# Patient Record
Sex: Male | Born: 1950
Health system: Southern US, Community
[De-identification: ages and names within clinical notes are randomized; demographics above are authoritative.]

## PROBLEM LIST (undated history)

## (undated) DIAGNOSIS — A159 Respiratory tuberculosis unspecified: Secondary | ICD-10-CM

## (undated) DIAGNOSIS — F4310 Post-traumatic stress disorder, unspecified: Secondary | ICD-10-CM

## (undated) DIAGNOSIS — E785 Hyperlipidemia, unspecified: Secondary | ICD-10-CM

## (undated) DIAGNOSIS — J449 Chronic obstructive pulmonary disease, unspecified: Secondary | ICD-10-CM

## (undated) HISTORY — DX: Hyperlipidemia, unspecified: E78.5

## (undated) HISTORY — DX: Post-traumatic stress disorder, unspecified: F43.10

## (undated) HISTORY — DX: Respiratory tuberculosis unspecified: A15.9

## (undated) HISTORY — DX: Chronic obstructive pulmonary disease, unspecified: J44.9

---

## 1994-08-23 DIAGNOSIS — A159 Respiratory tuberculosis unspecified: Secondary | ICD-10-CM

## 1994-08-23 HISTORY — DX: Respiratory tuberculosis unspecified: A15.9

## 2013-12-28 DIAGNOSIS — R7611 Nonspecific reaction to tuberculin skin test without active tuberculosis: Secondary | ICD-10-CM | POA: Insufficient documentation

## 2015-06-18 ENCOUNTER — Encounter (HOSPITAL_COMMUNITY): Payer: Self-pay | Admitting: Physical Medicine and Rehabilitation

## 2015-06-18 ENCOUNTER — Emergency Department (HOSPITAL_COMMUNITY): Payer: Medicaid Other

## 2015-06-18 ENCOUNTER — Emergency Department (HOSPITAL_COMMUNITY)
Admission: EM | Admit: 2015-06-18 | Discharge: 2015-06-18 | Disposition: A | Discharge status: Home / Self Care | Payer: Medicaid Other | Attending: Emergency Medicine | Admitting: Emergency Medicine

## 2015-06-18 DIAGNOSIS — J40 Bronchitis, not specified as acute or chronic: Secondary | ICD-10-CM

## 2015-06-18 DIAGNOSIS — R05 Cough: Secondary | ICD-10-CM | POA: Diagnosis present

## 2015-06-18 DIAGNOSIS — R51 Headache: Secondary | ICD-10-CM | POA: Diagnosis not present

## 2015-06-18 DIAGNOSIS — J45909 Unspecified asthma, uncomplicated: Secondary | ICD-10-CM | POA: Diagnosis not present

## 2015-06-18 LAB — I-STAT CHEM 8, ED
BUN: 9 mg/dL (ref 6–20)
CHLORIDE: 99 mmol/L — AB (ref 101–111)
CREATININE: 0.8 mg/dL (ref 0.61–1.24)
Calcium, Ion: 1.14 mmol/L (ref 1.13–1.30)
Glucose, Bld: 91 mg/dL (ref 65–99)
HEMATOCRIT: 50 % (ref 39.0–52.0)
Hemoglobin: 17 g/dL (ref 13.0–17.0)
Potassium: 4 mmol/L (ref 3.5–5.1)
SODIUM: 138 mmol/L (ref 135–145)
TCO2: 25 mmol/L (ref 0–100)

## 2015-06-18 LAB — CBC WITH DIFFERENTIAL/PLATELET
BASOS PCT: 0 %
Basophils Absolute: 0 10*3/uL (ref 0.0–0.1)
EOS ABS: 0.1 10*3/uL (ref 0.0–0.7)
Eosinophils Relative: 1 %
HEMATOCRIT: 45.9 % (ref 39.0–52.0)
HEMOGLOBIN: 15.9 g/dL (ref 13.0–17.0)
LYMPHS ABS: 2.4 10*3/uL (ref 0.7–4.0)
Lymphocytes Relative: 33 %
MCH: 31.6 pg (ref 26.0–34.0)
MCHC: 34.6 g/dL (ref 30.0–36.0)
MCV: 91.3 fL (ref 78.0–100.0)
MONOS PCT: 9 %
Monocytes Absolute: 0.6 10*3/uL (ref 0.1–1.0)
NEUTROS ABS: 4.1 10*3/uL (ref 1.7–7.7)
NEUTROS PCT: 57 %
Platelets: 186 10*3/uL (ref 150–400)
RBC: 5.03 MIL/uL (ref 4.22–5.81)
RDW: 13.3 % (ref 11.5–15.5)
WBC: 7.2 10*3/uL (ref 4.0–10.5)

## 2015-06-18 MED ORDER — IPRATROPIUM-ALBUTEROL 0.5-2.5 (3) MG/3ML IN SOLN
3.0000 mL | Freq: Once | RESPIRATORY_TRACT | Status: AC
  Administered 2015-06-18: 3 mL via RESPIRATORY_TRACT
  Filled 2015-06-18: qty 3

## 2015-06-18 MED ORDER — AZITHROMYCIN 250 MG PO TABS: ORAL_TABLET | ORAL | Status: DC

## 2015-06-18 MED ORDER — ALBUTEROL SULFATE (2.5 MG/3ML) 0.083% IN NEBU
2.5000 mg | INHALATION_SOLUTION | Freq: Once | RESPIRATORY_TRACT | Status: AC
  Administered 2015-06-18: 2.5 mg via RESPIRATORY_TRACT
  Filled 2015-06-18: qty 3

## 2015-06-18 MED ORDER — LEVOFLOXACIN 500 MG PO TABS: 500.0000 mg | ORAL_TABLET | Freq: Every day | ORAL | Status: DC

## 2015-06-18 MED ORDER — IOHEXOL 300 MG/ML  SOLN
75.0000 mL | Freq: Once | INTRAMUSCULAR | Status: AC | PRN
  Administered 2015-06-18: 75 mL via INTRAVENOUS

## 2015-06-18 NOTE — ED Notes (Signed)
Pt eating snack at the time. Awaiting CT results.

## 2015-06-18 NOTE — ED Notes (Signed)
CT notified, pt ready for study.

## 2015-06-18 NOTE — ED Notes (Signed)
PT placed in gown and in bed. Pt monitored by pulse ox and bp cuff. 

## 2015-06-18 NOTE — Discharge Instructions (Signed)
Follow up with Dr. Sherene SiresWert or one of his partners in 1-2 weeks

## 2015-06-18 NOTE — ED Notes (Signed)
Pt talking on phone in exam room, does not want to have IV started at the time.

## 2015-06-18 NOTE — ED Provider Notes (Signed)
CSN: [id number]     Arrival date & time 06/18/15  0913 History   First MD Initiated Contact with Patient 06/18/15 [phone]     Chief Complaint  Patient presents with  . Cough  . Headache     (Consider location/radiation/quality/duration/timing/severity/associated sxs/prior Treatment) Patient is a 64 y.o. male presenting with cough and headaches. The history is provided by the patient (The patient complains of cough yellow sputum production. No fever chills no blood. He does have a history of positive PPD in 1999).  Cough Associated symptoms: headaches   Associated symptoms: no chest pain, no eye discharge and no rash   Headache Associated symptoms: cough   Associated symptoms: no abdominal pain, no back pain, no congestion, no diarrhea, no fatigue, no seizures and no sinus pressure     Past Medical History  Diagnosis Date  . Asthma    History reviewed. No pertinent past surgical history. No family history on file. Social History  Substance Use Topics  . Smoking status: Never Smoker   . Smokeless tobacco: None  . Alcohol Use: No    Review of Systems  Constitutional: Negative for appetite change and fatigue.  HENT: Negative for congestion, ear discharge and sinus pressure.   Eyes: Negative for discharge.  Respiratory: Positive for cough.   Cardiovascular: Negative for chest pain.  Gastrointestinal: Negative for abdominal pain and diarrhea.  Genitourinary: Negative for frequency and hematuria.  Musculoskeletal: Negative for back pain.  Skin: Negative for rash.  Neurological: Positive for headaches. Negative for seizures.  Psychiatric/Behavioral: Negative for hallucinations.      Allergies  Review of patient's allergies indicates no known allergies.  Home Medications   Prior to Admission medications   Medication Sig Start Date End Date Taking? Authorizing Provider  levofloxacin (LEVAQUIN) 500 MG tablet Take 1 tablet (500 mg total) by mouth daily. 06/18/15   Bethann Berkshire, MD   BP 150/76 mmHg  Pulse 62  Temp(Src) 98.7 F (37.1 C) (Oral)  Resp 18  SpO2 99% Physical Exam  Constitutional: He is oriented to person, place, and time. He appears well-developed.  HENT:  Head: Normocephalic.  Eyes: Conjunctivae and EOM are normal. No scleral icterus.  Neck: Neck supple. No thyromegaly present.  Cardiovascular: Normal rate and regular rhythm.  Exam reveals no gallop and no friction rub.   No murmur heard. Pulmonary/Chest: No stridor. He has no wheezes. He has no rales. He exhibits no tenderness.  Abdominal: He exhibits no distension. There is no tenderness. There is no rebound.  Musculoskeletal: Normal range of motion. He exhibits no edema.  Lymphadenopathy:    He has no cervical adenopathy.  Neurological: He is oriented to person, place, and time. He exhibits normal muscle tone. Coordination normal.  Skin: No rash noted. No erythema.  Psychiatric: He has a normal mood and affect. His behavior is normal.    ED Course  Procedures (including critical care time) Labs Review Labs Reviewed  I-STAT CHEM 8, ED - Abnormal; Notable for the following:    Chloride 99 (*)    All other components within normal limits  CBC WITH DIFFERENTIAL/PLATELET    Imaging Review Dg Chest 2 View  06/18/2015  CLINICAL DATA:  64 year old male with productive cough, wheezing, shortness of breath, fever, headache. Initial encounter. EXAM: CHEST  2 VIEW COMPARISON:  None. FINDINGS: Large lung volumes. Tortuous thoracic aorta. Other mediastinal contours are within normal limits. Visualized tracheal air column is within normal limits. Bilateral hilar retraction indicating upper lobe scarring. There  is a combination of attenuated bronchovascular markings in the right suprahilar upper lobe, as well as reticulonodular increased density. No pneumothorax, pulmonary edema, pleural effusion or other confluent pulmonary opacity. Mild scoliosis. No acute osseous abnormality identified.  IMPRESSION: Hyperinflation and chronic upper lobe lung disease suspected with superimposed right upper lobe reticulonodular density suspicious for infectious exacerbation in this setting. No pleural effusion. Electronically Signed   By: Odessa FlemingH  Hall M.D.   On: 06/18/2015 10:20   Ct Chest W Contrast  06/18/2015  CLINICAL DATA:  Patient has history of TB. Patient complains of coughing yellow sputum for the last 4 days. EXAM: CT CHEST WITH CONTRAST TECHNIQUE: Multidetector CT imaging of the chest was performed during intravenous contrast administration. CONTRAST:  75mL OMNIPAQUE IOHEXOL 300 MG/ML  SOLN COMPARISON:  Chest radiograph dated 06/18/2015 FINDINGS: No pericardial effusion. The heart is normal in size. Thoracic aorta is normal in caliber. Visualized thyroid is unremarkable. No pathologically enlarged hilar, mediastinal, or axillary lymph nodes are seen. Airways are patent. The lungs are hyperinflated. There is a sub solid spiculated opacity which measures 12 mm in the superior segment of right lower lobe subpleurally. Similar linear ground-glass areas of opacity are seen in the posterior perifissural portion of the right upper lobe. There is atelectasis and volume loss in the right upper lobe. There is a 4.2 cm left parapelvic renal cyst. Visualized upper abdomen is otherwise unremarkable. No evidence of acute osseous abnormality. Osteoarthritic changes are seen in the lower thoracic and upper lumbar sacral spine. Evaluation of the osseous structures demonstrates disc space narrowing and associated endplate sclerotic and erosive changes at C7-T1, T11-T12, L1-L2 and L2-L3. The rest of the vertebral bodies and disc spaces are relatively preserved. IMPRESSION: Hyperinflation of the lungs, consistent with chronic obstructive pulmonary disease. Volume loss and atelectatic changes in the right upper lobe, with perifissural areas of streaky opacities. Subpleural area of ground-glass opacity in the superior segment of  right lower lobe. These findings may represent postinfectious scarring. Bronchoalveolar carcinoma, although possible, is felt less likely. There is no evidence of focal airspace consolidation, or cavitary lesions. Follow-up chest CT at 6-12 months is recommended. Disc centered sclerotic and erosive changes at C7-T1, T11-T12, L1-L2 and L2-L3. These may represent osteoarthritic changes, however in the background of relatively preserved dick spaces in the remaining thoracic spine, discitis is also of consideration. Left parapelvic renal cyst. Electronically Signed   By: Ted Mcalpineobrinka  Dimitrova M.D.   On: 06/18/2015 13:51   I have personally reviewed and evaluated these images and lab results as part of my medical decision-making.   EKG Interpretation None      MDM   Final diagnoses:  Bronchitis      Patient labs unremarkable CT scan shows an area that concerning for possible bronchogenic carcinoma. No obvious infection consistent with TB. Will treat patient with Levaquin for bronchitis. Will refer to pulmonary for follow-up  Bethann BerkshireJoseph Cela Newcom, MD 06/18/15 1418

## 2015-06-18 NOTE — ED Notes (Signed)
Pt reports cough with yellow sputum, headaches, fever and fatigue. Ongoing for several weeks. Reports he had TB in 1999, was treated at that time. Respirations unlabored. NAD

## 2015-07-08 ENCOUNTER — Institutional Professional Consult (permissible substitution): Payer: Medicaid Other | Admitting: Internal Medicine

## 2015-07-15 ENCOUNTER — Emergency Department (HOSPITAL_COMMUNITY)
Admission: EM | Admit: 2015-07-15 | Discharge: 2015-07-15 | Disposition: A | Discharge status: Home / Self Care | Payer: Medicare Other | Attending: Emergency Medicine | Admitting: Emergency Medicine

## 2015-07-15 ENCOUNTER — Encounter (HOSPITAL_COMMUNITY): Payer: Self-pay

## 2015-07-15 DIAGNOSIS — R319 Hematuria, unspecified: Secondary | ICD-10-CM

## 2015-07-15 DIAGNOSIS — N390 Urinary tract infection, site not specified: Secondary | ICD-10-CM | POA: Insufficient documentation

## 2015-07-15 DIAGNOSIS — F17210 Nicotine dependence, cigarettes, uncomplicated: Secondary | ICD-10-CM | POA: Insufficient documentation

## 2015-07-15 DIAGNOSIS — R300 Dysuria: Secondary | ICD-10-CM | POA: Diagnosis present

## 2015-07-15 DIAGNOSIS — J45909 Unspecified asthma, uncomplicated: Secondary | ICD-10-CM | POA: Diagnosis not present

## 2015-07-15 LAB — URINE MICROSCOPIC-ADD ON

## 2015-07-15 LAB — URINALYSIS, ROUTINE W REFLEX MICROSCOPIC
Glucose, UA: NEGATIVE mg/dL
Ketones, ur: NEGATIVE mg/dL
NITRITE: NEGATIVE
Protein, ur: NEGATIVE mg/dL
SPECIFIC GRAVITY, URINE: 1.023 (ref 1.005–1.030)
pH: 6 (ref 5.0–8.0)

## 2015-07-15 MED ORDER — CEPHALEXIN 500 MG PO CAPS: 500.0000 mg | ORAL_CAPSULE | Freq: Three times a day (TID) | ORAL | Status: DC

## 2015-07-15 NOTE — ED Notes (Signed)
Per Pt, Pt started to have burning with urination starting a few days ago. Pt denies discharge, nausea, vomiting, diarrhea, fevers, body aches. Reports pain only with urination.

## 2015-07-15 NOTE — ED Notes (Signed)
Patient walked out into the hallway. Patient reported he was going outside to smoke. Patient made aware that due to hospital policy, he is not allowed to leave the area of patient care. Made aware he is allowed to sign out as a patient and decline care to leave, but during this time He is not allowed to go outside and smoke. Patient offered a Nicotine patch and made aware that we are waiting on his urine to results and it would take about thirty minutes. Denied Nicotine patch. Patient reassured that as soon as the urine resulted, the MD would decide whether the patient needed antibiotics and we would work to get the patient out as soon as possible. Patient stated, "Thats okay, I will go out the back way." Pt asked not to leave or to sign out as a patient. Patient walked back into room and stated, "I will give you twenty minutes."

## 2015-07-15 NOTE — Discharge Instructions (Signed)

## 2015-07-15 NOTE — ED Notes (Signed)
MD Knott at the bedside   

## 2015-07-15 NOTE — ED Provider Notes (Signed)
CSN: [id number]     Arrival date & time 07/15/15  [id number] History   First MD Initiated Contact with Patient 07/15/15 [phone]     Chief Complaint  Patient presents with  . Dysuria     (Consider location/radiation/quality/duration/timing/severity/associated sxs/prior Treatment) Patient is a 64 y.o. male presenting with dysuria. The history is provided by the patient.  Dysuria This is a new problem. The current episode started more than 2 days ago. The problem occurs constantly. The problem has not changed since onset.Pertinent negatives include no abdominal pain and no shortness of breath. Nothing aggravates the symptoms. Nothing relieves the symptoms. He has tried nothing for the symptoms. The treatment provided no relief.    Past Medical History  Diagnosis Date  . Asthma    History reviewed. No pertinent past surgical history. History reviewed. No pertinent family history. Social History  Substance Use Topics  . Smoking status: Current Every Day Smoker -- 1.00 packs/day    Types: Cigarettes  . Smokeless tobacco: Never Used  . Alcohol Use: No    Review of Systems  Respiratory: Negative for shortness of breath.   Gastrointestinal: Negative for abdominal pain.  Genitourinary: Positive for dysuria.  All other systems reviewed and are negative.     Allergies  Review of patient's allergies indicates no known allergies.  Home Medications   Prior to Admission medications   Not on File   BP 144/103 mmHg  Pulse 66  Temp(Src) 97.7 F (36.5 C) (Oral)  Resp 20  SpO2 100% Physical Exam  Constitutional: He is oriented to person, place, and time. He appears well-developed and well-nourished. No distress.  HENT:  Head: Normocephalic and atraumatic.  Eyes: Conjunctivae are normal.  Neck: Neck supple. No tracheal deviation present.  Cardiovascular: Normal rate, regular rhythm and normal heart sounds.   Pulmonary/Chest: Effort normal and breath sounds normal. No respiratory  distress.  Abdominal: Soft. He exhibits no distension. There is no tenderness.  Neurological: He is alert and oriented to person, place, and time.  Skin: Skin is warm and dry.  Psychiatric: He has a normal mood and affect.    ED Course  Procedures (including critical care time) Labs Review Labs Reviewed  URINALYSIS, ROUTINE W REFLEX MICROSCOPIC (NOT AT [facility]) - Abnormal; Notable for the following:    APPearance HAZY (*)    Hgb urine dipstick SMALL (*)    Bilirubin Urine SMALL (*)    Leukocytes, UA SMALL (*)    All other components within normal limits  URINE MICROSCOPIC-ADD ON - Abnormal; Notable for the following:    Squamous Epithelial / LPF 0-5 (*)    Bacteria, UA RARE (*)    All other components within normal limits  URINE CULTURE    Imaging Review No results found. I have personally reviewed and evaluated these images and lab results as part of my medical decision-making.   EKG Interpretation None      MDM   Final diagnoses:  Urinary tract infection with hematuria, site unspecified    64 y.o. male presents with typical UTI symptom of dysuria. Has had once previously and stating this is similar. Given ABx for suspicious urinalysis results. Sent for culture. Patient needs to establish primary care in the area and was provided contact information to do so.     Lyndal Pulleyaniel Lizza Huffaker, MD 07/16/15 [phone]

## 2015-07-16 LAB — URINE CULTURE: CULTURE: NO GROWTH

## 2015-08-01 ENCOUNTER — Encounter: Payer: Self-pay | Admitting: Internal Medicine

## 2015-08-01 ENCOUNTER — Ambulatory Visit (INDEPENDENT_AMBULATORY_CARE_PROVIDER_SITE_OTHER): Payer: Medicare Other | Admitting: Internal Medicine

## 2015-08-01 ENCOUNTER — Other Ambulatory Visit (INDEPENDENT_AMBULATORY_CARE_PROVIDER_SITE_OTHER): Payer: Medicare Other

## 2015-08-01 VITALS — BP 136/84 | HR 60 | Ht 74.0 in | Wt 171.0 lb

## 2015-08-01 DIAGNOSIS — R053 Chronic cough: Secondary | ICD-10-CM

## 2015-08-01 DIAGNOSIS — Z720 Tobacco use: Secondary | ICD-10-CM | POA: Diagnosis not present

## 2015-08-01 DIAGNOSIS — E785 Hyperlipidemia, unspecified: Secondary | ICD-10-CM | POA: Diagnosis not present

## 2015-08-01 DIAGNOSIS — D751 Secondary polycythemia: Secondary | ICD-10-CM

## 2015-08-01 DIAGNOSIS — R05 Cough: Secondary | ICD-10-CM | POA: Diagnosis not present

## 2015-08-01 DIAGNOSIS — R9389 Abnormal findings on diagnostic imaging of other specified body structures: Secondary | ICD-10-CM

## 2015-08-01 DIAGNOSIS — F17210 Nicotine dependence, cigarettes, uncomplicated: Secondary | ICD-10-CM | POA: Diagnosis not present

## 2015-08-01 DIAGNOSIS — R938 Abnormal findings on diagnostic imaging of other specified body structures: Secondary | ICD-10-CM | POA: Diagnosis not present

## 2015-08-01 HISTORY — DX: Hyperlipidemia, unspecified: E78.5

## 2015-08-01 LAB — CBC WITH DIFFERENTIAL/PLATELET
BASOS ABS: 0 10*3/uL (ref 0.0–0.1)
Basophils Relative: 0.5 % (ref 0.0–3.0)
Eosinophils Absolute: 0 10*3/uL (ref 0.0–0.7)
Eosinophils Relative: 0.6 % (ref 0.0–5.0)
HCT: 53.5 % — ABNORMAL HIGH (ref 39.0–52.0)
Hemoglobin: 17.9 g/dL — ABNORMAL HIGH (ref 13.0–17.0)
LYMPHS ABS: 1.8 10*3/uL (ref 0.7–4.0)
Lymphocytes Relative: 29.8 % (ref 12.0–46.0)
MCHC: 33.4 g/dL (ref 30.0–36.0)
MCV: 92.1 fl (ref 78.0–100.0)
MONO ABS: 0.5 10*3/uL (ref 0.1–1.0)
Monocytes Relative: 8.5 % (ref 3.0–12.0)
NEUTROS ABS: 3.7 10*3/uL (ref 1.4–7.7)
NEUTROS PCT: 60.6 % (ref 43.0–77.0)
PLATELETS: 206 10*3/uL (ref 150.0–400.0)
RBC: 5.81 Mil/uL (ref 4.22–5.81)
RDW: 13.6 % (ref 11.5–15.5)
WBC: 6.2 10*3/uL (ref 4.0–10.5)

## 2015-08-01 LAB — LIPID PANEL
CHOLESTEROL: 156 mg/dL (ref 0–200)
HDL: 61.1 mg/dL (ref >39.00)
LDL CALC: 84 mg/dL (ref 0–99)
NonHDL: 94.77
TRIGLYCERIDES: 52 mg/dL (ref 0.0–149.0)
Total CHOL/HDL Ratio: 3
VLDL: 10.4 mg/dL (ref 0.0–40.0)

## 2015-08-01 LAB — HEPATIC FUNCTION PANEL
ALBUMIN: 4.4 g/dL (ref 3.5–5.2)
ALK PHOS: 58 U/L (ref 39–117)
ALT: 25 U/L (ref 0–53)
AST: 29 U/L (ref 0–37)
BILIRUBIN DIRECT: 0.2 mg/dL (ref 0.0–0.3)
Total Bilirubin: 0.7 mg/dL (ref 0.2–1.2)
Total Protein: 8.1 g/dL (ref 6.0–8.3)

## 2015-08-01 LAB — SEDIMENTATION RATE: SED RATE: 12 mm/h (ref 0–22)

## 2015-08-01 LAB — TSH: TSH: 0.74 u[IU]/mL (ref 0.35–4.50)

## 2015-08-01 NOTE — Patient Instructions (Signed)
The key is to stop smoking completely before smoking completely stops you!   Please remember to go to the lab  department downstairs for your tests - we will call you with the results when they are available.    Please schedule a follow up office visit in 6 weeks, call sooner if needed with pfts and cxr on return

## 2015-08-01 NOTE — Progress Notes (Signed)
Subjective:    Patient ID: Casey NunneryCharles Ryan, male    DOB: 12-13-1950,    MRN: [id number]  HPI   89 yobm active smoker referred by ER to pulmonary clinic 08/01/2015 re bronchitis / abn cxr   08/01/2015 1st [facility] office visit/ Sebastion Jun   Chief Complaint  Patient presents with  . Pulmonary Consult    Referred by Dr. Lawson FiscalJospeh Zammit. Pt states went to ED in Oct 2016 and was dxed with Bronchitis. All of his symptoms have since resolved and he has no co's today.   Not limited by breathing from desired activities   No am flares/ sleeps ok  Treated and released from TB in [place] in 1996 rx x 6244m  No  cp or chest tightness, subjective wheeze overt sinus or hb symptoms. No unusual exp hx or h/o childhood pna/ asthma or knowledge of premature birth.  Sleeping ok without nocturnal  or early am exacerbation  of respiratory  c/o's or need for noct saba. Also denies any obvious fluctuation of symptoms with weather or environmental changes or other aggravating or alleviating factors except as outlined above   Current Medications, Allergies, Complete Past Medical History, Past Surgical History, Family History, and Social History were reviewed in [facility] electronic medical record.              Review of Systems  Constitutional: Negative for fever, chills, activity change, appetite change and unexpected weight change.  HENT: Negative for congestion, dental problem, postnasal drip, rhinorrhea, sneezing, sore throat, trouble swallowing and voice change.   Eyes: Negative for visual disturbance.  Respiratory: Negative for cough, choking and shortness of breath.   Cardiovascular: Negative for chest pain and leg swelling.  Gastrointestinal: Negative for nausea, vomiting and abdominal pain.  Genitourinary: Negative for difficulty urinating.  Musculoskeletal: Negative for arthralgias.  Skin: Negative for rash.  Psychiatric/Behavioral: Negative for behavioral problems and confusion.        Objective:   Physical Exam  amb bm very somber  Wt Readings from Last 3 Encounters:  08/01/15 171 lb (77.565 kg)    Vital signs reviewed    HEENT: nl dentition, turbinates, and oropharynx. Nl external ear canals without cough reflex   NECK :  without JVD/Nodes/TM/ nl carotid upstrokes bilaterally   LUNGS: no acc muscle use,  Nl contour chest which is clear to A and P bilaterally without cough on insp or exp maneuvers   CV:  RRR  no s3 or murmur or increase in P2, no edema   ABD:  soft and nontender with nl inspiratory excursion in the supine position. No bruits or organomegaly, bowel sounds nl  MS:  Nl gait/ ext warm without deformities, calf tenderness, cyanosis or clubbing No obvious joint restrictions   SKIN: warm and dry without lesions    NEURO:  alert, approp, nl sensorium with  no motor deficits      I personally reviewed images and agree with radiology impression as follows:  CT Chest   06/18/15   Hyperinflation of the lungs, consistent with chronic obstructive pulmonary disease.  Volume loss and atelectatic changes in the right upper lobe, with perifissural areas of streaky opacities. Subpleural area of ground-glass opacity in the superior segment of right lower lobe.     Labs ordered/ reviewed:  Lab 08/01/15 0931  HGB 17.9*  HCT 53.5*  WBC 6.2  PLT 206.0     Lab Results  Component Value Date   TSH 0.74 08/01/2015  Lab Results  Component Value Date   ESRSEDRATE 12 08/01/2015         Assessment & Plan:

## 2015-08-02 DIAGNOSIS — R9389 Abnormal findings on diagnostic imaging of other specified body structures: Secondary | ICD-10-CM | POA: Insufficient documentation

## 2015-08-02 DIAGNOSIS — F17210 Nicotine dependence, cigarettes, uncomplicated: Secondary | ICD-10-CM | POA: Insufficient documentation

## 2015-08-02 DIAGNOSIS — D751 Secondary polycythemia: Secondary | ICD-10-CM | POA: Insufficient documentation

## 2015-08-02 NOTE — Assessment & Plan Note (Signed)
S/p rx for TB mid 1990s in [place],  no records available  His chest x-ray and symptoms are perfectly consistent with previous tuberculosis. Since we don't have previous x-rays I strongly recommended that he have a follow-up to quit multiple points on the curve and will place him in the typical file for this purpose  Discussed in detail all the  indications, usual  risks and alternatives  relative to the benefits with patient who agrees to proceed with conservative f/u as outlined

## 2015-08-02 NOTE — Assessment & Plan Note (Signed)

## 2015-08-02 NOTE — Assessment & Plan Note (Signed)
Lab Results  Component Value Date   CHOL 156 08/01/2015   HDL 61.10 08/01/2015   LDLCALC 84 08/01/2015   TRIG 52.0 08/01/2015   CHOLHDL 3 08/01/2015     This is a surprisingly good profile noting that the HDL is actually quite high for a smoker. No treatment needed

## 2015-08-02 NOTE — Assessment & Plan Note (Signed)
  Lab Results  Component Value Date   HGB 17.9* 08/01/2015   HGB 15.9 06/18/2015   HGB 17.0 06/18/2015    Advised this is likely due to cigarette smoking and again strongly should cut down if not quit altogether. We will check it again when returns

## 2015-08-02 NOTE — Assessment & Plan Note (Addendum)
C/w  CB from smoking, discussed separately  Needs baseline pfts to see if also has element of copd

## 2015-08-04 LAB — ALLERGY FULL PROFILE
Allergen, D pternoyssinus,d7: <0.1 kU/L
Allergen,Goose feathers, e70: <0.1 kU/L
Aspergillus fumigatus, m3: <0.1 kU/L
Bahia Grass: <0.1 kU/L
CANDIDA ALBICANS: 0.14 kU/L — AB
Common Ragweed: <0.1 kU/L
D. farinae: <0.1 kU/L
Elm IgE: <0.1 kU/L
Fescue: <0.1 kU/L
G005 Rye, Perennial: <0.1 kU/L
Goldenrod: <0.1 kU/L
Helminthosporium halodes: <0.1 kU/L
House Dust Hollister: <0.1 kU/L
IGE (IMMUNOGLOBULIN E), SERUM: 243 kU/L — AB (ref <115)
Lamb's Quarters: <0.1 kU/L
Stemphylium Botryosum: <0.1 kU/L
Sycamore Tree: <0.1 kU/L
Timothy Grass: <0.1 kU/L

## 2015-08-05 NOTE — Progress Notes (Signed)
Quick Note:  ATC, NA and unable to leave msg WCB ______ 

## 2015-08-06 NOTE — Progress Notes (Signed)
Quick Note:  Spoke with pt and notified of results per Dr. Wert. Pt verbalized understanding and denied any questions.  ______ 

## 2015-09-16 ENCOUNTER — Ambulatory Visit (INDEPENDENT_AMBULATORY_CARE_PROVIDER_SITE_OTHER): Payer: Medicare Other | Admitting: Internal Medicine

## 2015-09-16 ENCOUNTER — Ambulatory Visit (INDEPENDENT_AMBULATORY_CARE_PROVIDER_SITE_OTHER)
Admission: RE | Admit: 2015-09-16 | Discharge: 2015-09-16 | Disposition: A | Discharge status: Home / Self Care | Payer: Medicare Other | Source: Ambulatory Visit | Attending: Internal Medicine | Admitting: Internal Medicine

## 2015-09-16 ENCOUNTER — Encounter: Payer: Self-pay | Admitting: Internal Medicine

## 2015-09-16 VITALS — BP 116/74 | HR 89 | Ht 73.0 in | Wt 164.0 lb

## 2015-09-16 DIAGNOSIS — R938 Abnormal findings on diagnostic imaging of other specified body structures: Secondary | ICD-10-CM

## 2015-09-16 DIAGNOSIS — R9389 Abnormal findings on diagnostic imaging of other specified body structures: Secondary | ICD-10-CM

## 2015-09-16 DIAGNOSIS — Z23 Encounter for immunization: Secondary | ICD-10-CM | POA: Diagnosis not present

## 2015-09-16 DIAGNOSIS — J449 Chronic obstructive pulmonary disease, unspecified: Secondary | ICD-10-CM | POA: Diagnosis not present

## 2015-09-16 DIAGNOSIS — E785 Hyperlipidemia, unspecified: Secondary | ICD-10-CM

## 2015-09-16 DIAGNOSIS — R053 Chronic cough: Secondary | ICD-10-CM

## 2015-09-16 DIAGNOSIS — F17210 Nicotine dependence, cigarettes, uncomplicated: Secondary | ICD-10-CM

## 2015-09-16 DIAGNOSIS — R918 Other nonspecific abnormal finding of lung field: Secondary | ICD-10-CM | POA: Diagnosis not present

## 2015-09-16 DIAGNOSIS — R05 Cough: Secondary | ICD-10-CM | POA: Diagnosis not present

## 2015-09-16 DIAGNOSIS — Z720 Tobacco use: Secondary | ICD-10-CM

## 2015-09-16 LAB — PULMONARY FUNCTION TEST
DL/VA % PRED: 72 %
DL/VA: 3.45 ml/min/mmHg/L
DLCO UNC % PRED: 54 %
DLCO unc: 19.84 ml/min/mmHg
FEF 25-75 PRE: 0.61 L/s
FEF 25-75 Post: 0.94 L/sec
FEF2575-%Change-Post: 53 %
FEF2575-%PRED-POST: 31 %
FEF2575-%Pred-Pre: 20 %
FEV1-%Change-Post: 15 %
FEV1-%Pred-Post: 52 %
FEV1-%Pred-Pre: 45 %
FEV1-Post: 1.77 L
FEV1-Pre: 1.52 L
FEV1FVC-%Change-Post: 1 %
FEV1FVC-%Pred-Pre: 65 %
FEV6-%CHANGE-POST: 15 %
FEV6-%PRED-POST: 79 %
FEV6-%Pred-Pre: 68 %
FEV6-PRE: 2.9 L
FEV6-Post: 3.35 L
FEV6FVC-%CHANGE-POST: 0 %
FEV6FVC-%PRED-PRE: 100 %
FEV6FVC-%Pred-Post: 101 %
FVC-%CHANGE-POST: 14 %
FVC-%PRED-POST: 78 %
FVC-%Pred-Pre: 69 %
FVC-Post: 3.46 L
FVC-Pre: 3.02 L
POST FEV1/FVC RATIO: 51 %
POST FEV6/FVC RATIO: 97 %
Pre FEV1/FVC ratio: 50 %
Pre FEV6/FVC Ratio: 96 %

## 2015-09-16 NOTE — Progress Notes (Signed)
PFT done today. 

## 2015-09-16 NOTE — Patient Instructions (Addendum)
The key is to stop smoking completely before smoking completely stops you - it is not too late  If you start having limiting shortness of breath or cough, return asap   Pulmonary follow up is as needed   Please see patient coordinator before you leave today  to schedule Internal medicine referral

## 2015-09-16 NOTE — Progress Notes (Signed)
Subjective:    Patient ID: Casey Ryan, male    DOB: Feb 07, 1951     MRN: [id number]   Brief patient profile:  59 yobm active smoker referred by ER to pulmonary clinic 08/01/2015 re bronchitis / abn cxr  History of Present Illness  08/01/2015 1st [name]/ Casey Ryan   Chief Complaint  Patient presents with  . Pulmonary Consult    Referred by Dr. Lawson Fiscal. Pt states went to ED in Oct 2016 and was dxed with Bronchitis. All of his symptoms have since resolved and he has no co's today.   Not limited by breathing from desired activities   No am flares/ sleeps ok Treated and released from TB in [place] in 1996 rx x 50m rec The key is to stop smoking completely before smoking completely stops you!        09/16/2015  f/u ov/Casey Ryan re: GOLD II  Chief Complaint  Patient presents with  . Follow-up    PFT done today. Pt states that his breathing is doing well. He has no new co's today.     Not limited by breathing from desired activities  But not aerobically active / some am cough but not really bothering him/ clears quickly with min mucus production/ clear   No obvious day to day or daytime variability or assoc excess/ purulent sputum or mucus plugs   or cp or chest tightness, subjective wheeze or overt sinus or hb symptoms. No unusual exp hx or h/o childhood pna/ asthma or knowledge of premature birth.  Sleeping ok without nocturnal  or early am exacerbation  of respiratory  c/o's or need for noct saba. Also denies any obvious fluctuation of symptoms with weather or environmental changes or other aggravating or alleviating factors except as outlined above   Current Medications, Allergies, Complete Past Medical History, Past Surgical History, Family History, and Social History were reviewed in [facility] record.  ROS  The following are not active complaints unless bolded sore throat, dysphagia, dental problems, itching, sneezing,  nasal  congestion or excess/ purulent secretions, ear ache,   fever, chills, sweats, unintended wt loss, classically pleuritic or exertional cp, hemoptysis,  orthopnea pnd or leg swelling, presyncope, palpitations, abdominal pain, anorexia, nausea, vomiting, diarrhea  or change in bowel or bladder habits, change in stools or urine, dysuria,hematuria,  rash, arthralgias, visual complaints, headache, numbness, weakness or ataxia or problems with walking or coordination,  change in mood/affect or memory.         Objective:   Physical Exam  amb bm very somber    Wt Readings from Last 3 Encounters:  09/16/15 164 lb (74.39 kg)  08/01/15 171 lb (77.565 kg)    Vital signs reviewed    HEENT: nl dentition, turbinates, and oropharynx. Nl external ear canals without cough reflex   NECK :  without JVD/Nodes/TM/ nl carotid upstrokes bilaterally   LUNGS: no acc muscle use,  Nl contour chest which is clear to A and P bilaterally without cough on insp or exp maneuvers   CV:  RRR  no s3 or murmur or increase in P2, no edema   ABD:  soft and nontender with nl inspiratory excursion in the supine position. No bruits or organomegaly, bowel sounds nl  MS:  Nl gait/ ext warm without deformities, calf tenderness, cyanosis or clubbing No obvious joint restrictions   SKIN: warm and dry without lesions    NEURO:  alert, approp, nl sensorium with  no  motor deficits       CXR PA and Lateral:   09/16/2015 :    I personally reviewed images and agree with radiology impression as follows:    Stable scarring in the right upper lobe. No acute abnormality noted.  Labs  reviewed:  Lab 08/01/15 0931  HGB 17.9*  HCT 53.5*  WBC 6.2  PLT 206.0                 Assessment & Plan:

## 2015-09-17 DIAGNOSIS — J449 Chronic obstructive pulmonary disease, unspecified: Secondary | ICD-10-CM | POA: Insufficient documentation

## 2015-09-17 HISTORY — DX: Chronic obstructive pulmonary disease, unspecified: J44.9

## 2015-09-17 NOTE — Assessment & Plan Note (Signed)
PFT's  09/16/2015   FEV1 1.77 (52 % ) ratio 51  p 15 % improvement from saba with DLCO  54 % corrects to 72 % for alv volume on no rx previsit  I had an extended final summary discussion with the patient/wife reviewing all relevant studies completed to date and  lasting 15 to 20 minutes of a 25 minute visit on the following issues:      I reviewed the Fletcher curve with the patient that basically indicates  if you quit smoking when your best day FEV1 is still well preserved (as is clearly  the case here)  it is highly unlikely you will progress to severe disease and informed the patient there was no medication on the market that has proven to alter the curve/ its downward trajectory  or the likelihood of progression of their disease.  Therefore stopping smoking and maintaining abstinence is the most important aspect of care, not choice of inhalers or for that matter, doctors.    meds are prn symptoms at this point which she tends to minimize and is not interested in any form of therapy. This is fine with me because he really needs to focus more on stopping smoking than searching for a medical cure  that will fix the problem because one does not exist. The only way to change the natural history areas to stop smoking.

## 2015-09-17 NOTE — Assessment & Plan Note (Signed)
S/p rx for TB mid 1990s in [place], [place] no records available  Chest x-ray and history are consistent with treated TB. Nothing further at this point.

## 2015-09-17 NOTE — Assessment & Plan Note (Signed)
Discussed the risks and costs (both direct and indirect)  of smoking relative to the benefits of quitting but patient unwilling to commit at this point to a specific quit date.    Offered to help with quitting by use of chantix or referral to our Quit Smart program when the patient is ready.  

## 2015-09-17 NOTE — Assessment & Plan Note (Signed)
Referred to primary care

## 2015-09-25 ENCOUNTER — Encounter: Payer: Self-pay | Admitting: *Deleted

## 2015-10-07 ENCOUNTER — Telehealth: Payer: Self-pay | Admitting: Internal Medicine

## 2015-10-07 NOTE — Telephone Encounter (Signed)
APPT REMINDER CALL, NO ANSWER, VOICE MAIL NOT SET UP °

## 2015-10-08 ENCOUNTER — Encounter: Payer: Self-pay | Admitting: Internal Medicine

## 2015-10-08 ENCOUNTER — Ambulatory Visit (INDEPENDENT_AMBULATORY_CARE_PROVIDER_SITE_OTHER): Payer: Medicare Other | Admitting: Internal Medicine

## 2015-10-08 VITALS — BP 125/80 | HR 92 | Temp 98.3°F | Ht 73.0 in | Wt 162.4 lb

## 2015-10-08 DIAGNOSIS — J449 Chronic obstructive pulmonary disease, unspecified: Secondary | ICD-10-CM

## 2015-10-08 DIAGNOSIS — R3912 Poor urinary stream: Secondary | ICD-10-CM

## 2015-10-08 DIAGNOSIS — F17210 Nicotine dependence, cigarettes, uncomplicated: Secondary | ICD-10-CM | POA: Diagnosis not present

## 2015-10-08 DIAGNOSIS — Z8349 Family history of other endocrine, nutritional and metabolic diseases: Secondary | ICD-10-CM

## 2015-10-08 DIAGNOSIS — J441 Chronic obstructive pulmonary disease with (acute) exacerbation: Secondary | ICD-10-CM | POA: Diagnosis not present

## 2015-10-08 DIAGNOSIS — E785 Hyperlipidemia, unspecified: Secondary | ICD-10-CM

## 2015-10-08 MED ORDER — AZITHROMYCIN 250 MG PO TABS: ORAL_TABLET | ORAL | Status: DC

## 2015-10-08 MED ORDER — ALBUTEROL SULFATE HFA 108 (90 BASE) MCG/ACT IN AERS: 2.0000 | INHALATION_SPRAY | Freq: Four times a day (QID) | RESPIRATORY_TRACT | Status: DC | PRN

## 2015-10-08 MED ORDER — GUAIFENESIN-CODEINE 100-10 MG/5ML PO SOLN: 5.0000 mL | Freq: Three times a day (TID) | ORAL | Status: DC | PRN

## 2015-10-08 NOTE — Progress Notes (Signed)
[facility] Subjective:   Patient ID: Casey Ryan male   DOB: 02-17-1951 65 y.o.   MRN: [id number]  HPI: [name] is a 65 y.o. male with a PMH detailed below who presents to establish care with a PCP and for evaluation of a cold.  He has been seeing Dr Sherene Sires (pulm) for chronic cough and COPD but was encouraged to find a PCP for his chronic medical problems.  Today however he is reporting a cough, nasal congestion, headache, nasuea, loss of appetite, shortness of breath and interment fevers over about the last 3 weeks.  He reports that his cough is productive of white phlegm.  He notes that he has been a 2ppd smoker for about 5 years and that he quit yesterday.  He takes not medications at home.  He has not been able to sleep at ngiht due to his cough  He also reports that over the past 6 months he has lost 10 lbs, he is not sure why he is losing weight, he has not seen a doctor regularly in his life and has had no previous cancer screening and is concerned.  Past Medical History  Diagnosis Date  . Asthma   . Tuberculosis 1996    Completed treatment in [place], [place]  . COPD GOLD II with mild reversiblity  09/17/2015    PFT's  09/16/2015   FEV1 1.77 (52 % ) ratio 51  p 15 % improvement from saba with DLCO  54 % corrects to 72 % for alv volume on no rx previsit    Current Outpatient Prescriptions  Medication Sig Dispense Refill  . albuterol (PROVENTIL HFA;VENTOLIN HFA) 108 (90 Base) MCG/ACT inhaler Inhale 2 puffs into the lungs every 6 (six) hours as needed for wheezing or shortness of breath. 1 Inhaler 2  . azithromycin (ZITHROMAX Z-PAK) 250 MG tablet Take 2 tablets (500 mg) on  Day 1,  followed by 1 tablet (250 mg) once daily on Days 2 through 5. 6 each 0  . guaiFENesin-codeine 100-10 MG/5ML syrup Take 5 mLs by mouth 3 (three) times daily as needed for cough. 120 mL 1   No current facility-administered medications for this visit.   Patient denies any  family history of cancer, DM, HTN, or high cholesterol.   Social History   Social History  . Marital Status: Single    Spouse Name: N/A  . Number of Children: N/A  . Years of Education: N/A   Social History Main Topics  . Smoking status: Former Smoker -- 2.00 packs/day for 50 years    Types: Cigarettes    Start date: 08/23/1964    Quit date: 10/07/2015  . Smokeless tobacco: Never Used  . Alcohol Use: 0.0 oz/week    0 Standard drinks or equivalent per week     Comment: occasionally  . Drug Use: No  . Sexual Activity: Yes    Birth Control/ Protection: None   Other Topics Concern  . 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   Lives with Wife, no children   Review of Systems:  Review of Systems  Constitutional: Positive for fever, weight loss (10 lbs) and malaise/fatigue. Negative for chills.  HENT: Positive for congestion.   Eyes: Negative for blurred vision and photophobia.  Respiratory: Positive for cough, sputum production and shortness of breath. Negative for wheezing.   Cardiovascular: Negative for chest  pain and leg swelling.  Gastrointestinal: Negative for heartburn and abdominal pain.  Genitourinary: Negative for dysuria.       + weak urinary stream  Musculoskeletal: Negative for myalgias.  Skin: Negative for rash.  Neurological: Positive for headaches. Negative for dizziness.  Endo/Heme/Allergies: Negative for polydipsia.  Psychiatric/Behavioral: Negative for depression.  All other systems reviewed and are negative.    Objective:  Physical Exam: Filed Vitals:   10/08/15 1038  BP: 125/80  Pulse: 92  Temp: 98.3 F (36.8 C)  TempSrc: Oral  Height:  (1.854 m)  Weight: 162 lb 6.4 oz (73.664 kg)  SpO2: 100%  Physical Exam  Constitutional: He is oriented to person, place, and time and well-developed, well-nourished, and in no distress.  HENT:  Head: Normocephalic  and atraumatic.  Mouth/Throat: Oropharynx is clear and moist. No oropharyngeal exudate.  Eyes: Conjunctivae are normal.  Cardiovascular: Normal rate and regular rhythm.   Pulmonary/Chest: Effort normal and breath sounds normal. No respiratory distress. He has no wheezes.  Abdominal: Soft. Bowel sounds are normal.  Musculoskeletal: He exhibits no edema.  Neurological: He is alert and oriented to person, place, and time.  Skin: Skin is warm and dry.  Psychiatric:  anxious  Vitals reviewed.   Assessment & Plan:  Case discussed with Dr. Rogelia Boga  COPD GOLD II with mild reversiblity  A: COPD Gold stage 2 with mild exacerbation  P: - As he is on no home controlling medications will start Albuterol PRN - Rx Z-pack and guaifenesin-codeine. - I do not feel that he needs systemic steroids at this time but will have close follow up in 1 week.  Hyperlipidemia A: History of Hyperlipidemia with ASCVD risk of 14%  P: Review of his last lipid panel shows cholesterol in range however his ASCVD risk calculated today as a smoker is 14% as a non smoker this drops to 8.3%. -Did not get a chance to discuss high intensity statin for primary prevention today and will defer to next visit.  Cigarette smoker A; Cigarette smoker, quit yesterday  P: I congratulated him on quitting smoking, I hope that he will be able to maintain and offered my assistance with NRT or medications.  Time spent 1 minute.  Weak urinary stream A; Weak urniary stream noted on ROS  P: Patient deferred rectal prostate exam today due to time constrains     Medications Ordered Meds ordered this encounter  Medications  . albuterol (PROVENTIL HFA;VENTOLIN HFA) 108 (90 Base) MCG/ACT inhaler    Sig: Inhale 2 puffs into the lungs every 6 (six) hours as needed for wheezing or shortness of breath.    Dispense:  1 Inhaler    Refill:  2  . azithromycin (ZITHROMAX Z-PAK) 250 MG tablet    Sig: Take 2 tablets (500 mg) on  Day 1,   followed by 1 tablet (250 mg) once daily on Days 2 through 5.    Dispense:  6 each    Refill:  0  . guaiFENesin-codeine 100-10 MG/5ML syrup    Sig: Take 5 mLs by mouth 3 (three) times daily as needed for cough.    Dispense:  120 mL    Refill:  1   Other Orders No orders of the defined types were placed in this encounter.   Follow Up: Return in about 1 week (around 10/15/2015).

## 2015-10-08 NOTE — Patient Instructions (Signed)
General Instructions:   Thank you for bringing your medicines today. This helps us keep you safe from mistakes.   Progress Toward Treatment Goals:  No flowsheet data found.  Self Care Goals & Plans:  No flowsheet data found.  No flowsheet data found.   Care Management & Community Referrals:  No flowsheet data found.      

## 2015-10-11 ENCOUNTER — Encounter: Payer: Self-pay | Admitting: Internal Medicine

## 2015-10-11 DIAGNOSIS — R3912 Poor urinary stream: Secondary | ICD-10-CM | POA: Insufficient documentation

## 2015-10-11 NOTE — Assessment & Plan Note (Signed)
A; Cigarette smoker, quit yesterday  P: I congratulated him on quitting smoking, I hope that he will be able to maintain and offered my assistance with NRT or medications.  Time spent 1 minute.

## 2015-10-11 NOTE — Assessment & Plan Note (Signed)
A: History of Hyperlipidemia with ASCVD risk of 14%  P: Review of his last lipid panel shows cholesterol in range however his ASCVD risk calculated today as a smoker is 14% as a non smoker this drops to 8.3%. -Did not get a chance to discuss high intensity statin for primary prevention today and will defer to next visit.

## 2015-10-11 NOTE — Assessment & Plan Note (Signed)
A; Weak urniary stream noted on ROS  P: Patient deferred rectal prostate exam today due to time constrains

## 2015-10-11 NOTE — Assessment & Plan Note (Signed)
A: COPD Gold stage 2 with mild exacerbation  P: - As he is on no home controlling medications will start Albuterol PRN - Rx Z-pack and guaifenesin-codeine. - I do not feel that he needs systemic steroids at this time but will have close follow up in 1 week.

## 2015-10-13 NOTE — Progress Notes (Signed)
Internal Medicine Clinic Attending  Case discussed with Dr. Hoffman soon after the resident saw the patient.  We reviewed the resident's history and exam and pertinent patient test results.  I agree with the assessment, diagnosis, and plan of care documented in the resident's note. 

## 2015-10-21 ENCOUNTER — Ambulatory Visit (INDEPENDENT_AMBULATORY_CARE_PROVIDER_SITE_OTHER): Payer: Medicare Other | Admitting: Internal Medicine

## 2015-10-21 ENCOUNTER — Encounter: Payer: Self-pay | Admitting: Internal Medicine

## 2015-10-21 VITALS — BP 115/82 | HR 74 | Temp 97.9°F | Resp 18 | Ht 74.0 in | Wt 161.3 lb

## 2015-10-21 DIAGNOSIS — R3912 Poor urinary stream: Secondary | ICD-10-CM | POA: Diagnosis not present

## 2015-10-21 DIAGNOSIS — E785 Hyperlipidemia, unspecified: Secondary | ICD-10-CM | POA: Diagnosis not present

## 2015-10-21 DIAGNOSIS — F17210 Nicotine dependence, cigarettes, uncomplicated: Secondary | ICD-10-CM

## 2015-10-21 DIAGNOSIS — J449 Chronic obstructive pulmonary disease, unspecified: Secondary | ICD-10-CM

## 2015-10-21 MED ORDER — NICOTINE POLACRILEX 2 MG MT GUM: 2.0000 mg | CHEWING_GUM | OROMUCOSAL | Status: DC | PRN

## 2015-10-21 MED ORDER — NICOTINE 7 MG/24HR TD PT24: 7.0000 mg | MEDICATED_PATCH | TRANSDERMAL | Status: AC

## 2015-10-21 MED ORDER — NICOTINE 14 MG/24HR TD PT24: 14.0000 mg | MEDICATED_PATCH | TRANSDERMAL | Status: AC

## 2015-10-21 NOTE — Progress Notes (Signed)
[facility] Subjective:   Patient ID: Casey Ryan male   DOB: 07/16/1951 65 y.o.   MRN: [id number]  HPI: [name] is a 65 y.o. male with a PMH detailed below who presents for 2 week follow up for COPD.   Please see problem based charting below for the status of his chronic medical problems.   Past Medical History  Diagnosis Date  . Asthma   . Tuberculosis 1996    Completed treatment in [facility], [place]  . COPD GOLD II with mild reversiblity  09/17/2015    PFT's  09/16/2015   FEV1 1.77 (52 % ) ratio 51  p 15 % improvement from saba with DLCO  54 % corrects to 72 % for alv volume on no rx previsit   . Chronic cough 08/01/2015    rx for TB in [facility]  - allergy profile 12/76/16  IgE 243/ min yeast RAST     Current Outpatient Prescriptions  Medication Sig Dispense Refill  . albuterol (PROVENTIL HFA;VENTOLIN HFA) 108 (90 Base) MCG/ACT inhaler Inhale 2 puffs into the lungs every 6 (six) hours as needed for wheezing or shortness of breath. 1 Inhaler 2  . guaiFENesin-codeine 100-10 MG/5ML syrup Take 5 mLs by mouth 3 (three) times daily as needed for cough. 120 mL 1   No current facility-administered medications for this visit.   No family history on file. Social History   Social History  . Marital Status: Single    Spouse Name: N/A  . Number of Children: N/A  . Years of Education: N/A   Social History Main Topics  . Smoking status: Former Smoker -- 2.00 packs/day for 50 years    Types: Cigarettes    Start date: 08/23/1964    Quit date: 10/07/2015  . Smokeless tobacco: Never Used  . Alcohol Use: 0.0 oz/week    0 Standard drinks or equivalent per week     Comment: occasionally  . Drug Use: No  . Sexual Activity: Yes    Birth Control/ Protection: None   Other Topics Concern  . 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   Lives with Wife, no  children   Review of Systems: Review of Systems  Constitutional: Negative for fever.  Respiratory: Negative for cough, sputum production, shortness of breath and wheezing.   Cardiovascular: Negative for chest pain.  Genitourinary: Negative for dysuria, urgency, frequency and hematuria.  Neurological: Negative for headaches.     Objective:  Physical Exam: Filed Vitals:   10/21/15 0824  BP: 115/82  Pulse: 74  Temp: 97.9 F (36.6 C)  TempSrc: Oral  Resp: 18  Height:  (1.88 m)  Weight: 161 lb 4.8 oz (73.165 kg)  SpO2: 99%   Physical Exam  Constitutional: He is well-developed, well-nourished, and in no distress.  Cardiovascular: Normal rate and regular rhythm.   Pulmonary/Chest: Effort normal and breath sounds normal. He has no wheezes.  Nursing note and vitals reviewed.    Assessment & Plan:  Case discussed with Dr. Rogelia Boga  COPD GOLD II with mild reversiblity  HPI: Patient did very well with Zpack and PRN albuterol.  Reports he is feeling "great" today.  A: COPD GOLD II  P: Continue PRN Albuterol for now Working on smoking cessation Follow up in 1-2 months, consider adding Spiriva if needed.  Hyperlipidemia A: HLD  P: Discussed lipid panel  looks great, still at elevated ASCVD risk, patients wants to focus on smoking cessation which I agree with.  Cigarette smoker  Assessment: Progress toward smoking cessation:  smoking less Barriers to progress toward smoking cessation:  withdrawal symptoms Comments: He is smoking 1 PPD currently  Plan: Instruction/counseling given:  I counseled patient on the dangers of tobacco use, advised patient to stop smoking, and reviewed strategies to maximize success. Educational resources provided:  [employer]) brochure Self management tools provided:    Medications to assist with smoking cessation:  Nicotine Patch, Nicotine gum Patient agreed to the following self-care plans for smoking cessation:    Other  plans: Prescribed patches and gum for smoking cessation, patient will follow up in 1-2 months. I spent a dedicated 5 minutes on smoking cessation    Weak urinary stream HPI: He still has some weak urinary stream, not very bothersome. I discussed prostate examination and PSA testing.  He is hesitant at doing either at this time and wants to focus on smoking cessation, "I will be ready at the next visit."  A: Weak urinary stream  P: DRE examination and consider starting flomax at next visit.    Medications Ordered Meds ordered this encounter  Medications  . nicotine (NICODERM CQ - DOSED IN MG/24 HOURS) 14 mg/24hr patch    Sig: Place 1 patch (14 mg total) onto the skin daily.    Dispense:  14 patch    Refill:  0  . nicotine (NICODERM CQ - DOSED IN MG/24 HR) 7 mg/24hr patch    Sig: Place 1 patch (7 mg total) onto the skin daily.    Dispense:  14 patch    Refill:  1  . nicotine polacrilex (CVS NICOTINE POLACRILEX) 2 MG gum    Sig: Take 1 each (2 mg total) by mouth as needed for smoking cessation.    Dispense:  100 tablet    Refill:  1   Other Orders No orders of the defined types were placed in this encounter.   Follow Up: Return 1-2 months.

## 2015-10-21 NOTE — Assessment & Plan Note (Signed)
  Assessment: Progress toward smoking cessation:  smoking less Barriers to progress toward smoking cessation:  withdrawal symptoms Comments: He is smoking 1 PPD currently  Plan: Instruction/counseling given:  I counseled patient on the dangers of tobacco use, advised patient to stop smoking, and reviewed strategies to maximize success. Educational resources provided:  [employer]) brochure Self management tools provided:    Medications to assist with smoking cessation:  Nicotine Patch, Nicotine gum Patient agreed to the following self-care plans for smoking cessation:    Other plans: Prescribed patches and gum for smoking cessation, patient will follow up in 1-2 months. I spent a dedicated 5 minutes on smoking cessation

## 2015-10-21 NOTE — Patient Instructions (Signed)
General Instructions:   Please bring your medicines with you each time you come to clinic.  Medicines may include prescription medications, over-the-counter medications, herbal remedies, eye drops, vitamins, or other pills.   Progress Toward Treatment Goals:  No flowsheet data found.  Self Care Goals & Plans:  No flowsheet data found.  No flowsheet data found.   Care Management & Community Referrals:  No flowsheet data found.      

## 2015-10-21 NOTE — Assessment & Plan Note (Signed)
HPI: Patient did very well with Zpack and PRN albuterol.  Reports he is feeling "great" today.  A: COPD GOLD II  P: Continue PRN Albuterol for now Working on smoking cessation Follow up in 1-2 months, consider adding Spiriva if needed.

## 2015-10-21 NOTE — Assessment & Plan Note (Signed)
HPI: He still has some weak urinary stream, not very bothersome. I discussed prostate examination and PSA testing.  He is hesitant at doing either at this time and wants to focus on smoking cessation, "I will be ready at the next visit."  A: Weak urinary stream  P: DRE examination and consider starting flomax at next visit.

## 2015-10-21 NOTE — Assessment & Plan Note (Signed)
A: HLD  P: Discussed lipid panel looks great, still at elevated ASCVD risk, patients wants to focus on smoking cessation which I agree with.

## 2015-10-24 NOTE — Progress Notes (Signed)
Internal Medicine Clinic Attending  Case discussed with Dr. Hoffman soon after the resident saw the patient.  We reviewed the resident's history and exam and pertinent patient test results.  I agree with the assessment, diagnosis, and plan of care documented in the resident's note. 

## 2015-12-10 ENCOUNTER — Telehealth: Payer: Self-pay | Admitting: Internal Medicine

## 2015-12-10 NOTE — Telephone Encounter (Signed)
I attempted to contact the patient to inquire about his mental status (see Dr. Eliane DecreePatel's previous telephone note) and to see if had any suicidal ideation and/or homicidal ideation. The patient did not answer the phone, and he had not set up his voicemail.

## 2015-12-10 NOTE — Telephone Encounter (Signed)
   Reason for call:   I received a call from Casey Ryan, on behalf of [name] at 513  PM indicating need for referral. .   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   Assessment / Plan / Recommendations:   I explained to her that I could not follow up on this while I am after hours.   I also explained that I do not see written permission from [name] in his chart for me to discuss the details of his care with her and that I would route a message to the PCP for further action.   Casey Arbourushil Ward Boissonneault V, MD   12/10/2015, 5:20 PM

## 2015-12-11 ENCOUNTER — Telehealth: Payer: Self-pay | Admitting: *Deleted

## 2015-12-11 NOTE — Telephone Encounter (Signed)
RETURNED CALL TO Casey Ryan [phone] / [facility] IS NEEDING A CLINIC APPOINTMENT TO BE SEEN / NEEDING REFERRAL/ MS Casey Ryan WAS INSTRUCTED TO CALL [phone] TO MAKE APPOINTMENT FOR MR Casey Ryan.

## 2015-12-16 ENCOUNTER — Ambulatory Visit (INDEPENDENT_AMBULATORY_CARE_PROVIDER_SITE_OTHER): Payer: Commercial Managed Care - HMO | Admitting: Pulmonary Disease

## 2015-12-16 ENCOUNTER — Encounter: Payer: Self-pay | Admitting: Pulmonary Disease

## 2015-12-16 ENCOUNTER — Telehealth: Payer: Self-pay | Admitting: Licensed Clinical Social Worker

## 2015-12-16 VITALS — BP 111/77 | HR 92 | Temp 97.8°F | Ht 74.0 in | Wt 156.6 lb

## 2015-12-16 DIAGNOSIS — M129 Arthropathy, unspecified: Secondary | ICD-10-CM

## 2015-12-16 DIAGNOSIS — F4310 Post-traumatic stress disorder, unspecified: Secondary | ICD-10-CM

## 2015-12-16 DIAGNOSIS — F419 Anxiety disorder, unspecified: Secondary | ICD-10-CM | POA: Insufficient documentation

## 2015-12-16 DIAGNOSIS — M25561 Pain in right knee: Secondary | ICD-10-CM | POA: Diagnosis not present

## 2015-12-16 HISTORY — DX: Post-traumatic stress disorder, unspecified: F43.10

## 2015-12-16 MED ORDER — DICLOFENAC SODIUM 1 % TD GEL: 4.0000 g | Freq: Four times a day (QID) | TRANSDERMAL | Status: DC

## 2015-12-16 NOTE — Assessment & Plan Note (Signed)
Previously in [employer]. Accompanied by "sister," who has called our clinic on his behalf for concerns of PTSD. He is very resistant to talking to me about his symptoms and just insists that he wants referral to psychiatry.  Information provided on [facility] referral for behavioral health
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
## 2015-12-16 NOTE — Telephone Encounter (Signed)
[name] has Behavioral Health referral for psychiatry.  CSW placed called to pt.  [name] voice mail is not set up and unable to leave message.  Pt's [place] indicates [employer] [place] and Medicaid coverage.  Utilized both [employer]urance directories to find [employer]-network providers which would include: [facility], [facility], [facility] (will need to confirm) and [facility].   [name] returned call to this worker.  Pt states he has not received behavioral health services in the past but [place] [facility]. [name] states he had a bad times in during the [place] which lead to his discharge from the [employer].  Pt unable to access his VA benefits due to this discharge.   CSW dicussed network providers available through both [place] and [place].  Pt states he has information on [facility] and will utilize their services.  Pt denies add'l social work needs at this time.
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
## 2015-12-17 NOTE — Progress Notes (Signed)
Case discussed with Dr. Krall soon after the resident saw the patient.  We reviewed the resident's history and exam and pertinent patient test results.  I agree with the assessment, diagnosis, and plan of care documented in the resident's note. 

## 2016-01-12 ENCOUNTER — Telehealth: Payer: Self-pay | Admitting: Licensed Clinical Social Worker

## 2016-01-12 ENCOUNTER — Ambulatory Visit (INDEPENDENT_AMBULATORY_CARE_PROVIDER_SITE_OTHER): Payer: Commercial Managed Care - HMO | Admitting: Internal Medicine

## 2016-01-12 ENCOUNTER — Encounter: Payer: Self-pay | Admitting: Internal Medicine

## 2016-01-12 VITALS — BP 127/74 | HR 62 | Temp 97.8°F | Ht 74.0 in | Wt 149.5 lb

## 2016-01-12 DIAGNOSIS — F4310 Post-traumatic stress disorder, unspecified: Secondary | ICD-10-CM

## 2016-01-12 DIAGNOSIS — F17210 Nicotine dependence, cigarettes, uncomplicated: Secondary | ICD-10-CM | POA: Diagnosis not present

## 2016-01-12 DIAGNOSIS — R053 Chronic cough: Secondary | ICD-10-CM

## 2016-01-12 DIAGNOSIS — J449 Chronic obstructive pulmonary disease, unspecified: Secondary | ICD-10-CM

## 2016-01-12 DIAGNOSIS — R05 Cough: Secondary | ICD-10-CM | POA: Diagnosis not present

## 2016-01-12 MED ORDER — ALBUTEROL SULFATE HFA 108 (90 BASE) MCG/ACT IN AERS: 2.0000 | INHALATION_SPRAY | Freq: Four times a day (QID) | RESPIRATORY_TRACT | Status: DC | PRN

## 2016-01-12 MED ORDER — VARENICLINE TARTRATE 0.5 MG X 11 & 1 MG X 42 PO MISC
ORAL | Status: DC
Start: 2016-01-12 — End: 2016-01-12

## 2016-01-12 MED ORDER — PREDNISONE 20 MG PO TABS: 40.0000 mg | ORAL_TABLET | Freq: Every day | ORAL | Status: DC

## 2016-01-12 MED ORDER — AZITHROMYCIN 250 MG PO TABS: ORAL_TABLET | ORAL | Status: DC

## 2016-01-12 NOTE — Assessment & Plan Note (Addendum)
Pt with chronic cough and dyspnea.  Unfortunately, he is still smoking 1ppd.  He quit briefly while using the nicotine patch and gum, but started back.  He reports worsening symptoms since his last OV.  He states that albuterol and a zpak helped him but he ran out of albuterol and his symptoms returned.  He also reports decreased appetite and weight loss.  Last CXR was in January and showed stable scarring in the RUL.  He has previously been treated for  TB yrs ago.  His symptoms are most c/w a AECOPD given increased sputum production, cough, and dyspnea.  Afebrile on exam.  He is able to walk only a short distance about 1/2 block before getting SOB.  No sick contacts.  Has been followed by Dr. Sherene SiresWert who also stressed smoking cessation.  -prednisone 40mg  x 5 days -zpak -chantix starter pack -given info on quit line  -refilled albuterol   ADDENDUM:  Will change chantix to zyban given possible PTSD history  -needs prevnar 13 at next OV  -f/u in 2-3 wks or if worsening  -may refer for pulmonary rehab

## 2016-01-12 NOTE — Progress Notes (Signed)
Pt called - left message not to fill Chantix rx per Dr Delane GingerGill.

## 2016-01-12 NOTE — Assessment & Plan Note (Addendum)
Pt reports chronic cough with sputum production.  Unfortunately, he returned to smoking 1 ppd after quitting while using the nicotine patch.  Although he reports that the patch "falls off and will not stay on." -will try chantix -try nicotine patch

## 2016-01-12 NOTE — Telephone Encounter (Signed)
CSW placed called to pt.  CSW left message requesting return call. CSW provided contact hours and phone number.  Pt had [facility]  [facility], pt would like to change providers.  Several other providers are in-network with pt's [employer] Medicare and Medicaid insurance.  CSW will [facility] to [facility], as this is the closest provider that accepts both.

## 2016-01-12 NOTE — Assessment & Plan Note (Signed)
He is requesting a referral to psychiatry.  [facility] but has been unable to see a psychiatrist. -referral to social work

## 2016-01-12 NOTE — Progress Notes (Signed)
Patient ID: Modena NunneryCharles Machi, male   DOB: 04-29-1951, 65 y.o.   MRN: [id number]     Subjective:   Patient ID: Modena NunneryCharles Gordon male    DOB: 04-29-1951 65 y.o.    MRN: [id number] Health Maintenance Due: Health Maintenance Due  Topic Date Due  . Hepatitis C Screening  04-29-1951  . HIV Screening  07/05/1965  . TETANUS/TDAP  07/05/1969  . COLONOSCOPY  07/05/2000  . ZOSTAVAX  07/05/2010  . PNA vac Low Risk Adult (1 of 2 - PCV13) 07/06/2015    _________________________________________________  HPI: Mr.Bell Harvie is a 65 y.o. male here for an acute visit for dyspnea.  Pt has a PMH outlined below.  Please see problem-based charting assessment and plan for further status of patient's chronic medical problems addressed at today's visit.  PMH: Past Medical History  Diagnosis Date  . Asthma   . Tuberculosis 1996    [place] [facility] [place]  . COPD GOLD II with mild reversiblity  09/17/2015    PFT's  09/16/2015   FEV1 1.77 (52 % ) ratio 51  p 15 % improvement from saba with DLCO  54 % corrects to 72 % for alv volume on no rx previsit   . Chronic cough 08/01/2015    rx for TB in 1996 [facility]  - allergy profile 12/76/16  IgE 243/ min yeast RAST      Medications: Current Outpatient Prescriptions on File Prior to Visit  Medication Sig Dispense Refill  . diclofenac sodium (VOLTAREN) 1 % GEL Apply 4 g topically 4 (four) times daily. 100 g 1  . nicotine polacrilex ([employer]S NICOTINE POLACRILEX) 2 MG gum Take 1 each (2 mg total) by mouth as needed for smoking cessation. 100 tablet 1   No current facility-administered medications on file prior to visit.    Allergies: No Known Allergies  FH: No family history on file.  SH: Social History   Social History  . Marital Status: Single    Spouse Name: N/A  . Number of Children: N/A  . Years of Education: N/A   Social History Main Topics  . Smoking status: Current Every Day Smoker -- 1.00 packs/day for 50 years    Types:  Cigarettes    Start date: 08/23/1964    Last Attempt to Quit: 10/07/2015  . Smokeless tobacco: Never Used     Comment: Cutting back  . Alcohol Use: 0.0 oz/week    0 Standard drinks or equivalent per week     Comment: occasionally  . Drug Use: No  . Sexual Activity: Not Asked   Other Topics Concern  . 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   Lives with Wife, no children    Review of Systems: Constitutional: +fever, chills.  Eyes: Negative for blurred vision.  Respiratory: +cough and shortness of breath.  Cardiovascular: Negative for chest pain.  Gastrointestinal: Negative for nausea, vomiting. Neurological: +dizziness.   Objective:   Vital Signs: Filed Vitals:   01/12/16 0948  BP: 127/74  Pulse: 62  Temp: 97.8 F (36.6 C)  TempSrc: Oral  Height: 6\' 2"  (1.88 m)  Weight: 149 lb 8 oz (67.813 kg)  SpO2: 100%      BP Readings from Last 3 Encounters:  01/12/16 127/74  12/16/15 111/77  10/21/15 115/82    Physical Exam: Constitutional: Vital signs reviewed.  Patient is in NAD and cooperative with exam.  Head: Normocephalic and atraumatic. Eyes: EOMI, conjunctivae nl, no scleral icterus.  Neck: Supple. Cardiovascular: RRR, no MRG. Pulmonary/Chest: Normal effort, wheezing throughout. Abdominal: +BS. Neurological: A&O x3, cranial nerves II-XII are grossly intact, moving all extremities. Extremities: No LE edema. Skin: Warm, dry and intact. No rash.   Assessment & Plan:   Assessment and plan was discussed and formulated with my attending.

## 2016-01-12 NOTE — Assessment & Plan Note (Addendum)
-  see assessment and plan for COPD

## 2016-01-12 NOTE — Patient Instructions (Signed)
Thank you for your visit today.   Please return to the internal medicine clinic in about 2-3 weeks or sooner if needed.     I have made the following additions/changes to your medications:  I have given you chantix to help with quitting smoking.  Please take this as directed. I have also given you an inhaler.  Please use this as needed every 6 hours. I have also given you some prednisone to help with your wheezing.  Please take 2 tabs daily for 5 days. I have also given you an antibiotic to take for 5 days. You really need to quit smoking. You should also follow up with Dr. Sherene Sires.    Please be sure to bring all of your medications with you to every visit; this includes herbal supplements, vitamins, eye drops, and any over-the-counter medications.   Should you have any questions regarding your medications and/or any new or worsening symptoms, please be sure to call the clinic at [phone].   If you believe that you are suffering from a life threatening condition or one that may result in the loss of limb or function, then you should call 911 and proceed to the nearest Emergency Department.    Chronic Obstructive Pulmonary Disease Chronic obstructive pulmonary disease (COPD) is a common lung condition in which airflow from the lungs is limited. COPD is a general term that can be used to describe many different lung problems that limit airflow, including both chronic bronchitis and emphysema. If you have COPD, your lung function will probably never return to normal, but there are measures you can take to improve lung function and make yourself feel better. CAUSES   Smoking (common).  Exposure to secondhand smoke.  Genetic problems.  Chronic inflammatory lung diseases or recurrent infections. SYMPTOMS  Shortness of breath, especially with physical activity.  Deep, persistent (chronic) cough with a large amount of thick mucus.  Wheezing.  Rapid breaths (tachypnea).  Gray or  bluish discoloration (cyanosis) of the skin, especially in your fingers, toes, or lips.  Fatigue.  Weight loss.  Frequent infections or episodes when breathing symptoms become much worse (exacerbations).  Chest tightness. DIAGNOSIS Your health care provider will take a medical history and perform a physical examination to diagnose COPD. Additional tests for COPD may include:  Lung (pulmonary) function tests.  Chest X-ray.  CT scan.  Blood tests. TREATMENT  Treatment for COPD may include:  Inhaler and nebulizer medicines. These help manage the symptoms of COPD and make your breathing more comfortable.  Supplemental oxygen. Supplemental oxygen is only helpful if you have a low oxygen level in your blood.  Exercise and physical activity. These are beneficial for nearly all people with COPD.  Lung surgery or transplant.  Nutrition therapy to gain weight, if you are underweight.  Pulmonary rehabilitation. This may involve working with a team of health care providers and specialists, such as respiratory, occupational, and physical therapists. HOME CARE INSTRUCTIONS  Take all medicines (inhaled or pills) as directed by your health care provider.  Avoid over-the-counter medicines or cough syrups that dry up your airway (such as antihistamines) and slow down the elimination of secretions unless instructed otherwise by your health care provider.  If you are a smoker, the most important thing that you can do is stop smoking. Continuing to smoke will cause further lung damage and breathing trouble. Ask your health care provider for help with quitting smoking. He or she can direct you to community resources or hospitals that  provide support.  Avoid exposure to irritants such as smoke, chemicals, and fumes that aggravate your breathing.  Use oxygen therapy and pulmonary rehabilitation if directed by your health care provider. If you require home oxygen therapy, ask your health care  provider whether you should purchase a pulse oximeter to measure your oxygen level at home.  Avoid contact with individuals who have a contagious illness.  Avoid extreme temperature and humidity changes.  Eat healthy foods. Eating smaller, more frequent meals and resting before meals may help you maintain your strength.  Stay active, but balance activity with periods of rest. Exercise and physical activity will help you maintain your ability to do things you want to do.  Preventing infection and hospitalization is very important when you have COPD. Make sure to receive all the vaccines your health care provider recommends, especially the pneumococcal and influenza vaccines. Ask your health care provider whether you need a pneumonia vaccine.  Learn and use relaxation techniques to manage stress.  Learn and use controlled breathing techniques as directed by your health care provider. Controlled breathing techniques include:  Pursed lip breathing. Start by breathing in (inhaling) through your nose for 1 second. Then, purse your lips as if you were going to whistle and breathe out (exhale) through the pursed lips for 2 seconds.  Diaphragmatic breathing. Start by putting one hand on your abdomen just above your waist. Inhale slowly through your nose. The hand on your abdomen should move out. Then purse your lips and exhale slowly. You should be able to feel the hand on your abdomen moving in as you exhale.  Learn and use controlled coughing to clear mucus from your lungs. Controlled coughing is a series of short, progressive coughs. The steps of controlled coughing are: 1. Lean your head slightly forward. 2. Breathe in deeply using diaphragmatic breathing. 3. Try to hold your breath for 3 seconds. 4. Keep your mouth slightly open while coughing twice. 5. Spit any mucus out into a tissue. 6. Rest and repeat the steps once or twice as needed. SEEK MEDICAL CARE IF:  You are coughing up more  mucus than usual.  There is a change in the color or thickness of your mucus.  Your breathing is more labored than usual.  Your breathing is faster than usual. SEEK IMMEDIATE MEDICAL CARE IF:  You have shortness of breath while you are resting.  You have shortness of breath that prevents you from:  Being able to talk.  Performing your usual physical activities.  You have chest pain lasting longer than 5 minutes.  Your skin color is more cyanotic than usual.  You measure low oxygen saturations for longer than 5 minutes with a pulse oximeter. MAKE SURE YOU:  Understand these instructions.  Will watch your condition.  Will get help right away if you are not doing well or get worse.   This information is not intended to replace advice given to you by your health care provider. Make sure you discuss any questions you have with your health care provider.   Document Released: 05/19/2005 Document Revised: 08/30/2014 Document Reviewed: 04/05/2013 Elsevier Interactive Patient Education [employer]2016 Elsevier Inc.

## 2016-01-15 NOTE — Progress Notes (Signed)
Internal Medicine Clinic Attending  Case discussed with Dr. Gill at the time of the visit.  We reviewed the resident's history and exam and pertinent patient test results.  I agree with the assessment, diagnosis, and plan of care documented in the resident's note.  

## 2016-01-17 ENCOUNTER — Emergency Department (HOSPITAL_COMMUNITY): Payer: Commercial Managed Care - HMO

## 2016-01-17 ENCOUNTER — Encounter (HOSPITAL_COMMUNITY): Payer: Self-pay | Admitting: Family Medicine

## 2016-01-17 ENCOUNTER — Emergency Department (HOSPITAL_COMMUNITY)
Admission: EM | Admit: 2016-01-17 | Discharge: 2016-01-17 | Disposition: A | Discharge status: Home / Self Care | Payer: Commercial Managed Care - HMO | Attending: Emergency Medicine | Admitting: Emergency Medicine

## 2016-01-17 DIAGNOSIS — Z8619 Personal history of other infectious and parasitic diseases: Secondary | ICD-10-CM | POA: Diagnosis not present

## 2016-01-17 DIAGNOSIS — R079 Chest pain, unspecified: Secondary | ICD-10-CM | POA: Diagnosis not present

## 2016-01-17 DIAGNOSIS — J449 Chronic obstructive pulmonary disease, unspecified: Secondary | ICD-10-CM

## 2016-01-17 DIAGNOSIS — F17210 Nicotine dependence, cigarettes, uncomplicated: Secondary | ICD-10-CM | POA: Insufficient documentation

## 2016-01-17 DIAGNOSIS — Z79899 Other long term (current) drug therapy: Secondary | ICD-10-CM | POA: Insufficient documentation

## 2016-01-17 DIAGNOSIS — R0602 Shortness of breath: Secondary | ICD-10-CM | POA: Diagnosis not present

## 2016-01-17 DIAGNOSIS — J441 Chronic obstructive pulmonary disease with (acute) exacerbation: Secondary | ICD-10-CM | POA: Insufficient documentation

## 2016-01-17 DIAGNOSIS — R05 Cough: Secondary | ICD-10-CM | POA: Diagnosis not present

## 2016-01-17 LAB — BASIC METABOLIC PANEL
ANION GAP: 8 (ref 5–15)
BUN: 16 mg/dL (ref 6–20)
CALCIUM: 9.1 mg/dL (ref 8.9–10.3)
CO2: 26 mmol/L (ref 22–32)
CREATININE: 0.99 mg/dL (ref 0.61–1.24)
Chloride: 95 mmol/L — ABNORMAL LOW (ref 101–111)
GFR calc Af Amer: >60 mL/min (ref >60)
GLUCOSE: 115 mg/dL — AB (ref 65–99)
Potassium: 4.4 mmol/L (ref 3.5–5.1)
Sodium: 129 mmol/L — ABNORMAL LOW (ref 135–145)

## 2016-01-17 LAB — I-STAT TROPONIN, ED: TROPONIN I, POC: 0.01 ng/mL (ref 0.00–0.08)

## 2016-01-17 LAB — CBC
HCT: 48 % (ref 39.0–52.0)
Hemoglobin: 16.9 g/dL (ref 13.0–17.0)
MCH: 31.5 pg (ref 26.0–34.0)
MCHC: 35.2 g/dL (ref 30.0–36.0)
MCV: 89.4 fL (ref 78.0–100.0)
PLATELETS: 176 10*3/uL (ref 150–400)
RBC: 5.37 MIL/uL (ref 4.22–5.81)
RDW: 12.4 % (ref 11.5–15.5)
WBC: 9 10*3/uL (ref 4.0–10.5)

## 2016-01-17 MED ORDER — ALBUTEROL SULFATE HFA 108 (90 BASE) MCG/ACT IN AERS: 2.0000 | INHALATION_SPRAY | RESPIRATORY_TRACT | Status: DC | PRN

## 2016-01-17 MED ORDER — IPRATROPIUM-ALBUTEROL 0.5-2.5 (3) MG/3ML IN SOLN
3.0000 mL | RESPIRATORY_TRACT | Status: DC
  Administered 2016-01-17 (×2): 3 mL via RESPIRATORY_TRACT
  Filled 2016-01-17 (×2): qty 3

## 2016-01-17 MED ORDER — ALBUTEROL SULFATE HFA 108 (90 BASE) MCG/ACT IN AERS
2.0000 | INHALATION_SPRAY | Freq: Once | RESPIRATORY_TRACT | Status: DC
  Filled 2016-01-17: qty 6.7

## 2016-01-17 MED ORDER — PREDNISONE 20 MG PO TABS: ORAL_TABLET | ORAL | Status: DC

## 2016-01-17 MED ORDER — METHYLPREDNISOLONE SODIUM SUCC 125 MG IJ SOLR
125.0000 mg | Freq: Once | INTRAMUSCULAR | Status: AC
  Administered 2016-01-17: 125 mg via INTRAVENOUS
  Filled 2016-01-17: qty 2

## 2016-01-17 MED ORDER — IOPAMIDOL (ISOVUE-370) INJECTION 76%
INTRAVENOUS | Status: AC
  Administered 2016-01-17: 80 mL
  Filled 2016-01-17: qty 100

## 2016-01-17 NOTE — Progress Notes (Signed)
Patient instructed on use of inhaler, says he is very familiar, demonstrated knowledge, RT left Albuterol inhaler at bedside to take home.

## 2016-01-17 NOTE — ED Notes (Signed)
Pt resting  More relaxed.  Going to c-t for a C-T angiogram

## 2016-01-17 NOTE — ED Notes (Signed)
The pt is alert no distress 

## 2016-01-17 NOTE — ED Notes (Signed)
Pt  Initially hsd sob with wheezes through out his lungs   Better after the hhn

## 2016-01-17 NOTE — ED Notes (Signed)
Pt here for SOB since last week. sts was given abx last week and inhaler with no relief. sts with movement his chest gets heavy and SOB. sts cough and congestion.

## 2016-01-17 NOTE — ED Provider Notes (Signed)
CSN: [id number]     Arrival date & time 01/17/16  1604 History   First MD Initiated Contact with Patient 01/17/16 1721     Chief Complaint  Patient presents with  . Shortness of Breath     (Consider location/radiation/quality/duration/timing/severity/associated sxs/prior Treatment) Patient is a 65 y.o. male presenting with shortness of breath.  Shortness of Breath Severity:  Mild Duration:  1 week Timing:  Intermittent Chronicity:  New Context: not activity   Ineffective treatments: antibiotics. Associated symptoms: cough   Associated symptoms: no abdominal pain, no chest pain, no fever and no vomiting     Past Medical History  Diagnosis Date  . Asthma   . Tuberculosis 1996    Completed treatment in [facility], [place]  . COPD GOLD II with mild reversiblity  09/17/2015    PFT's  09/16/2015   FEV1 1.77 (52 % ) ratio 51  p 15 % improvement from saba with DLCO  54 % corrects to 72 % for alv volume on no rx previsit   . Chronic cough 08/01/2015    rx for TB in 1996 [facility]  - allergy profile 12/76/16  IgE 243/ min yeast RAST     History reviewed. No pertinent past surgical history. History reviewed. No pertinent family history. Social History  Substance Use Topics  . Smoking status: Current Every Day Smoker -- 1.00 packs/day for 50 years    Types: Cigarettes    Start date: 08/23/1964    Last Attempt to Quit: 10/07/2015  . Smokeless tobacco: Never Used     Comment: Cutting back  . Alcohol Use: 0.0 oz/week    0 Standard drinks or equivalent per week     Comment: occasionally    Review of Systems  Constitutional: Negative for fever and chills.  Eyes: Negative for pain.  Respiratory: Positive for cough and shortness of breath.   Cardiovascular: Negative for chest pain.  Gastrointestinal: Negative for nausea, vomiting, abdominal pain and diarrhea.  Endocrine: Negative for polydipsia and polyuria.  Musculoskeletal: Negative for back pain.  All other systems reviewed and are  negative.     Allergies  Review of patient's allergies indicates no known allergies.  Home Medications   Prior to Admission medications   Medication Sig Start Date End Date Taking? Authorizing Provider  Multiple Vitamin (MULTIVITAMIN WITH MINERALS) TABS tablet Take 1 tablet by mouth daily. Centrum Silver   Yes Historical Provider, MD  nicotine polacrilex ([employer]S NICOTINE POLACRILEX) 2 MG gum Take 1 each (2 mg total) by mouth as needed for smoking cessation. Patient taking differently: Take 2 mg by mouth 4 (four) times daily as needed for smoking cessation.  10/21/15  Yes Gust Rung, DO  albuterol (PROVENTIL HFA;VENTOLIN HFA) 108 (90 Base) MCG/ACT inhaler Inhale 2 puffs into the lungs every 4 (four) hours as needed for wheezing or shortness of breath. 01/17/16   Marily Memos, MD  predniSONE (DELTASONE) 20 MG tablet 2 tabs po daily x 4 days 01/18/16   Marily Memos, MD   BP 164/88 mmHg  Pulse 48  Temp(Src) 99.6 F (37.6 C) (Oral)  Resp 21  SpO2 95% Physical Exam  Constitutional: He is oriented to person, place, and time. He appears well-developed and well-nourished.  HENT:  Head: Normocephalic and atraumatic.  Neck: Normal range of motion.  Cardiovascular: Normal rate.   Pulmonary/Chest: Effort normal. No respiratory distress. He has decreased breath sounds. He has wheezes.  Abdominal: Soft. He exhibits no distension. There is no tenderness.  Musculoskeletal:  Normal range of motion. He exhibits no edema or tenderness.  Neurological: He is alert and oriented to person, place, and time.  Skin: Skin is warm and dry.  Nursing note and vitals reviewed.   ED Course  Procedures (including critical care time) Labs Review Labs Reviewed  BASIC METABOLIC PANEL - Abnormal; Notable for the following:    Sodium 129 (*)    Chloride 95 (*)    Glucose, Bld 115 (*)    All other components within normal limits  CBC  I-STAT TROPOININ, ED    Imaging Review Dg Chest 2 View  01/17/2016   CLINICAL DATA:  Patient with shortness of breath. Cough and congestion. EXAM: CHEST  2 VIEW COMPARISON:  Chest radiograph 09/16/2015. FINDINGS: Stable cardiac and mediastinal contours. Pulmonary hyperinflation. Stable right upper lobe scarring. No pleural effusion or pneumothorax. Thoracic spine degenerative changes. IMPRESSION: No acute cardiopulmonary process. Electronically Signed   By: Annia Beltrew  Davis M.D.   On: 01/17/2016 16:52   Ct Angio Chest Pe W/cm &/or Wo Cm  01/17/2016  CLINICAL DATA:  Acute onset of generalized chest pain and shortness of breath. Initial encounter. EXAM: CT ANGIOGRAPHY CHEST WITH CONTRAST TECHNIQUE: Multidetector CT imaging of the chest was performed using the standard protocol during bolus administration of intravenous contrast. Multiplanar CT image reconstructions and MIPs were obtained to evaluate the vascular anatomy. CONTRAST:  80 mL of Isovue 370 IV contrast COMPARISON:  Chest radiograph performed earlier today at 4:23 p.m. FINDINGS: There is no evidence of pulmonary embolus. A spiculated density at the right lower lobe, measuring 1.7 cm, is thought to reflect scarring, given the lack of significant masslike density. Additional scarring is noted at the right upper lobe. No pleural effusion or pneumothorax is seen. The mediastinum is unremarkable in appearance. No mediastinal lymphadenopathy is seen. No pericardial effusion is identified. The great vessels are grossly unremarkable in appearance. No axillary lymphadenopathy is seen. The visualized portions of the thyroid gland are unremarkable in appearance. The visualized portions of the liver and spleen are unremarkable. Left renal cysts measure up to 3.2 cm in size. No acute osseous abnormalities are seen. Endplate sclerotic change is noted at the lower cervical spine, T11-T12, and at the superior endplate of L2. Review of the MIP images confirms the above findings. IMPRESSION: 1. No evidence of pulmonary embolus. 2. Scarring at  the right upper and lower lobes. Lungs otherwise clear. 3. Left renal cysts noted. 4. Mild degenerative change at the lower cervical and thoracic spine. Electronically Signed   By: Roanna RaiderJeffery  Chang M.D.   On: 01/17/2016 19:44   I have personally reviewed and evaluated these images and lab results as part of my medical decision-making.   EKG Interpretation   Date/Time:  Saturday Jan 17 2016 16:07:45 EDT Ventricular Rate:  92 PR Interval:  132 QRS Duration: 84 QT Interval:  318 QTC Calculation: 393 R Axis:   83 Text Interpretation:  Normal sinus rhythm Biatrial enlargement Septal  infarct , age undetermined Abnormal ECG No previous ECGs available  Confirmed by [facility] MD, Barbara CowerJASON [phone]([id number]) on 01/17/2016 5:10:43 PM      MDM   Final diagnoses:  COPD GOLD II with mild reversiblity     Likely copd exacerbation. Albuterol and steroids at home. No respiratory distress or other evidence of severe distress.   New Prescriptions: Discharge Medication List as of 01/17/2016  8:11 PM    START taking these medications   Details  predniSONE (DELTASONE) 20 MG tablet 2 tabs po  daily x 4 days, Print         I have personally and contemperaneously reviewed labs and imaging and used in my decision making as above.   A medical screening exam was performed and I feel the patient has had an appropriate workup for their chief complaint at this time and likelihood of emergent condition existing is low and thus workup can continue on an outpatient basis.. Their vital signs are stable. They have been counseled on decision, discharge, follow up and which symptoms necessitate immediate return to the emergency department.  They verbally stated understanding and agreement with plan and discharged in stable condition.      Marily Memos, MD 01/17/16 2308

## 2016-01-17 NOTE — ED Notes (Signed)
The pt is obviously breathing better. He is able to breath without sounding so congested but when he is told that he immediately starts coughing and saying that he is not bringing it up.  Otherwise he is resting v ery well

## 2016-01-18 ENCOUNTER — Encounter (HOSPITAL_COMMUNITY): Payer: Self-pay

## 2016-01-18 ENCOUNTER — Emergency Department (HOSPITAL_COMMUNITY)
Admission: EM | Admit: 2016-01-18 | Discharge: 2016-01-18 | Disposition: A | Discharge status: Home / Self Care | Payer: Commercial Managed Care - HMO | Attending: Emergency Medicine | Admitting: Emergency Medicine

## 2016-01-18 DIAGNOSIS — J449 Chronic obstructive pulmonary disease, unspecified: Secondary | ICD-10-CM | POA: Insufficient documentation

## 2016-01-18 DIAGNOSIS — Z79899 Other long term (current) drug therapy: Secondary | ICD-10-CM | POA: Insufficient documentation

## 2016-01-18 DIAGNOSIS — R109 Unspecified abdominal pain: Secondary | ICD-10-CM | POA: Diagnosis present

## 2016-01-18 DIAGNOSIS — F17200 Nicotine dependence, unspecified, uncomplicated: Secondary | ICD-10-CM | POA: Insufficient documentation

## 2016-01-18 DIAGNOSIS — R1032 Left lower quadrant pain: Secondary | ICD-10-CM | POA: Insufficient documentation

## 2016-01-18 DIAGNOSIS — R1031 Right lower quadrant pain: Secondary | ICD-10-CM | POA: Insufficient documentation

## 2016-01-18 DIAGNOSIS — R1012 Left upper quadrant pain: Secondary | ICD-10-CM | POA: Diagnosis not present

## 2016-01-18 LAB — COMPREHENSIVE METABOLIC PANEL
ALBUMIN: 3.7 g/dL (ref 3.5–5.0)
ALT: 31 U/L (ref 17–63)
AST: 28 U/L (ref 15–41)
Alkaline Phosphatase: 53 U/L (ref 38–126)
Anion gap: 10 (ref 5–15)
BUN: 17 mg/dL (ref 6–20)
CO2: 23 mmol/L (ref 22–32)
Calcium: 9.5 mg/dL (ref 8.9–10.3)
Chloride: 98 mmol/L — ABNORMAL LOW (ref 101–111)
Creatinine, Ser: 0.92 mg/dL (ref 0.61–1.24)
GFR calc non Af Amer: >60 mL/min (ref >60)
GLUCOSE: 166 mg/dL — AB (ref 65–99)
POTASSIUM: 4.7 mmol/L (ref 3.5–5.1)
SODIUM: 131 mmol/L — AB (ref 135–145)
Total Bilirubin: 1 mg/dL (ref 0.3–1.2)
Total Protein: 7.5 g/dL (ref 6.5–8.1)

## 2016-01-18 LAB — URINALYSIS, ROUTINE W REFLEX MICROSCOPIC
Bilirubin Urine: NEGATIVE
GLUCOSE, UA: NEGATIVE mg/dL
HGB URINE DIPSTICK: NEGATIVE
Ketones, ur: NEGATIVE mg/dL
Leukocytes, UA: NEGATIVE
Nitrite: NEGATIVE
Protein, ur: NEGATIVE mg/dL
SPECIFIC GRAVITY, URINE: 1.027 (ref 1.005–1.030)
pH: 6.5 (ref 5.0–8.0)

## 2016-01-18 LAB — CBC
HCT: 48.3 % (ref 39.0–52.0)
HEMOGLOBIN: 17.1 g/dL — AB (ref 13.0–17.0)
MCH: 31.7 pg (ref 26.0–34.0)
MCHC: 35.4 g/dL (ref 30.0–36.0)
MCV: 89.6 fL (ref 78.0–100.0)
Platelets: 184 10*3/uL (ref 150–400)
RBC: 5.39 MIL/uL (ref 4.22–5.81)
RDW: 12.4 % (ref 11.5–15.5)
WBC: 16.2 10*3/uL — ABNORMAL HIGH (ref 4.0–10.5)

## 2016-01-18 LAB — LIPASE, BLOOD: Lipase: 44 U/L (ref 11–51)

## 2016-01-18 MED ORDER — ONDANSETRON 4 MG PO TBDP
4.0000 mg | ORAL_TABLET | Freq: Once | ORAL | Status: AC
  Administered 2016-01-18: 4 mg via ORAL
  Filled 2016-01-18: qty 1

## 2016-01-18 MED ORDER — GI COCKTAIL ~~LOC~~
30.0000 mL | Freq: Once | ORAL | Status: AC
  Administered 2016-01-18: 30 mL via ORAL
  Filled 2016-01-18: qty 30

## 2016-01-18 MED ORDER — SIMETHICONE 80 MG PO CHEW: 80.0000 mg | CHEWABLE_TABLET | Freq: Four times a day (QID) | ORAL | Status: DC | PRN

## 2016-01-18 MED ORDER — IBUPROFEN 600 MG PO TABS: 600.0000 mg | ORAL_TABLET | Freq: Four times a day (QID) | ORAL | Status: DC | PRN

## 2016-01-18 MED ORDER — ONDANSETRON 4 MG PO TBDP: 4.0000 mg | ORAL_TABLET | Freq: Three times a day (TID) | ORAL | Status: DC | PRN

## 2016-01-18 MED ORDER — IBUPROFEN 400 MG PO TABS
600.0000 mg | ORAL_TABLET | Freq: Once | ORAL | Status: AC
  Administered 2016-01-18: 600 mg via ORAL
  Filled 2016-01-18: qty 1

## 2016-01-18 NOTE — ED Notes (Signed)
The pt  Was given med urine requested call to go to br

## 2016-01-18 NOTE — ED Notes (Signed)
Patient here with ongoing abdominal pain with nausea and vomiting. Seen yesterday for breathing and abdomen and is feeling worse. No active vomiting on arrival

## 2016-01-18 NOTE — Discharge Instructions (Signed)

## 2016-01-18 NOTE — ED Notes (Signed)
The pt is c/o lower abd pain for weeks  He was just here yesterday for breathing problems and did not mention the abd pain then.   Nauseated  He reports that he cannot eat

## 2016-01-18 NOTE — ED Provider Notes (Signed)
CSN: [id number]     Arrival date & time 01/18/16  1539 History   First MD Initiated Contact with Patient 01/18/16 1702     Chief Complaint  Patient presents with  . Abdominal Pain     (Consider location/radiation/quality/duration/timing/severity/associated sxs/prior Treatment) Patient is a 65 y.o. male presenting with abdominal pain. The history is provided by the patient.  Abdominal Pain Pain location:  Suprapubic Pain quality: cramping and sharp   Pain radiates to:  Does not radiate Pain severity:  Moderate Onset quality:  Gradual Duration:  1 day Timing:  Intermittent Progression:  Waxing and waning Chronicity:  New Associated symptoms: anorexia (loss of appetite), shortness of breath and vomiting (x2 today)   Associated symptoms: no fever   Shortness of breath:    Severity:  Moderate   Onset quality:  Gradual   Progression:  Improving (since starting steroids) Risk factors: alcohol abuse (2 beers/day) and being elderly     Past Medical History  Diagnosis Date  . Asthma   . Tuberculosis 1996    [place]  . COPD GOLD II with mild reversiblity  09/17/2015    PFT's  09/16/2015   FEV1 1.77 (52 % ) ratio 51  p 15 % improvement from saba with DLCO  54 % corrects to 72 % for alv volume on no rx previsit   . Chronic cough 08/01/2015    rx for TB in 1996 [place]  - allergy profile 12/76/16  IgE 243/ min yeast RAST     History reviewed. No pertinent past surgical history. No family history on file. Social History  Substance Use Topics  . Smoking status: Current Every Day Smoker -- 1.00 packs/day for 50 years    Types: Cigarettes    Start date: 08/23/1964    Last Attempt to Quit: 10/07/2015  . Smokeless tobacco: Never Used     Comment: Cutting back  . Alcohol Use: 0.0 oz/week    0 Standard drinks or equivalent per week     Comment: occasionally    Review of Systems  Constitutional: Negative for fever.  Respiratory: Positive for shortness  of breath.   Gastrointestinal: Positive for vomiting (x2 today), abdominal pain and anorexia (loss of appetite).  All other systems reviewed and are negative.     Allergies  Review of patient's allergies indicates no known allergies.  Home Medications   Prior to Admission medications   Medication Sig Start Date End Date Taking? Authorizing Provider  albuterol (PROVENTIL HFA;VENTOLIN HFA) 108 (90 Base) MCG/ACT inhaler Inhale 2 puffs into the lungs every 4 (four) hours as needed for wheezing or shortness of breath. 01/17/16   Marily MemosJason Mesner, MD  Multiple Vitamin (MULTIVITAMIN WITH MINERALS) TABS tablet Take 1 tablet by mouth daily. Centrum Silver    Historical Provider, MD  nicotine polacrilex ([employer] NICOTINE POLACRILEX) 2 MG gum Take 1 each (2 mg total) by mouth as needed for smoking cessation. Patient taking differently: Take 2 mg by mouth 4 (four) times daily as needed for smoking cessation.  10/21/15   Gust RungErik C Hoffman, DO  predniSONE (DELTASONE) 20 MG tablet 2 tabs po daily x 4 days 01/18/16   Marily MemosJason Mesner, MD   BP 129/94 mmHg  Pulse 70  Temp(Src) 98.6 F (37 C) (Oral)  Resp 22  SpO2 98% Physical Exam  Constitutional: He is oriented to person, place, and time. He appears well-developed and well-nourished. No distress.  HENT:  Head: Normocephalic and atraumatic.  Eyes: Conjunctivae  are normal.  Neck: Neck supple. No tracheal deviation present.  Cardiovascular: Normal rate and regular rhythm.   Pulmonary/Chest: Effort normal. No respiratory distress. He has wheezes (inspiratory and expiratory).  Abdominal: Soft. He exhibits no distension. There is no tenderness. There is no rebound and no guarding.  Neurological: He is alert and oriented to person, place, and time.  Skin: Skin is warm and dry.  Psychiatric: He has a normal mood and affect.    ED Course  Procedures (including critical care time) Labs Review Labs Reviewed  COMPREHENSIVE METABOLIC PANEL - Abnormal; Notable for the  following:    Sodium 131 (*)    Chloride 98 (*)    Glucose, Bld 166 (*)    All other components within normal limits  CBC - Abnormal; Notable for the following:    WBC 16.2 (*)    Hemoglobin 17.1 (*)    All other components within normal limits  LIPASE, BLOOD    Imaging Review Dg Chest 2 View  01/17/2016  CLINICAL DATA:  Patient with shortness of breath. Cough and congestion. EXAM: CHEST  2 VIEW COMPARISON:  Chest radiograph 09/16/2015. FINDINGS: Stable cardiac and mediastinal contours. Pulmonary hyperinflation. Stable right upper lobe scarring. No pleural effusion or pneumothorax. Thoracic spine degenerative changes. IMPRESSION: No acute cardiopulmonary process. Electronically Signed   By: Annia Belt M.D.   On: 01/17/2016 16:52   Ct Angio Chest Pe W/cm &/or Wo Cm  01/17/2016  CLINICAL DATA:  Acute onset of generalized chest pain and shortness of breath. Initial encounter. EXAM: CT ANGIOGRAPHY CHEST WITH CONTRAST TECHNIQUE: Multidetector CT imaging of the chest was performed using the standard protocol during bolus administration of intravenous contrast. Multiplanar CT image reconstructions and MIPs were obtained to evaluate the vascular anatomy. CONTRAST:  80 mL of Isovue 370 IV contrast COMPARISON:  Chest radiograph performed earlier today at 4:23 p.m. FINDINGS: There is no evidence of pulmonary embolus. A spiculated density at the right lower lobe, measuring 1.7 cm, is thought to reflect scarring, given the lack of significant masslike density. Additional scarring is noted at the right upper lobe. No pleural effusion or pneumothorax is seen. The mediastinum is unremarkable in appearance. No mediastinal lymphadenopathy is seen. No pericardial effusion is identified. The great vessels are grossly unremarkable in appearance. No axillary lymphadenopathy is seen. The visualized portions of the thyroid gland are unremarkable in appearance. The visualized portions of the liver and spleen are  unremarkable. Left renal cysts measure up to 3.2 cm in size. No acute osseous abnormalities are seen. Endplate sclerotic change is noted at the lower cervical spine, T11-T12, and at the superior endplate of L2. Review of the MIP images confirms the above findings. IMPRESSION: 1. No evidence of pulmonary embolus. 2. Scarring at the right upper and lower lobes. Lungs otherwise clear. 3. Left renal cysts noted. 4. Mild degenerative change at the lower cervical and thoracic spine. Electronically Signed   By: Roanna Raider M.D.   On: 01/17/2016 19:44   I have personally reviewed and evaluated these images and lab results as part of my medical decision-making.   EKG Interpretation None      MDM   Final diagnoses:  Bilateral lower abdominal cramping    65 y.o. male presents with lower abdominal cramping, some nausea and vomiting twice today. Labs reassuring except leukocytosis but Pt is on active high dose prednisone therapy for respiratory problems. He states his breathing is better. He was offered supportive care measures after his abdominal exam  was found to be wholly reassuring for lack of appendicitis, cholecystitis or other surgical emergency. Pt denies diarrhea or blood in stool. Reassessed and feels better, offered zofran and motrin for home care as well as simethicone as Pt feels like he has gas. Recommended continuing prednisone therapy to completion. Return precautions discussed for worsening or new concerning symptoms.     Lyndal Pulley, MD 01/19/16 [phone]

## 2016-01-19 LAB — URINE CULTURE

## 2016-02-12 ENCOUNTER — Ambulatory Visit: Payer: Commercial Managed Care - HMO | Admitting: Internal Medicine

## 2016-02-12 ENCOUNTER — Encounter: Payer: Self-pay | Admitting: Student in an Organized Health Care Education/Training Program

## 2016-02-19 ENCOUNTER — Encounter: Payer: Self-pay | Admitting: Internal Medicine

## 2016-02-19 ENCOUNTER — Ambulatory Visit (INDEPENDENT_AMBULATORY_CARE_PROVIDER_SITE_OTHER): Payer: Medicare HMO | Admitting: Internal Medicine

## 2016-02-19 VITALS — BP 129/88 | HR 80 | Temp 97.6°F | Ht 74.0 in | Wt 144.2 lb

## 2016-02-19 DIAGNOSIS — L84 Corns and callosities: Secondary | ICD-10-CM | POA: Diagnosis not present

## 2016-02-19 DIAGNOSIS — Z23 Encounter for immunization: Secondary | ICD-10-CM

## 2016-02-19 DIAGNOSIS — J449 Chronic obstructive pulmonary disease, unspecified: Secondary | ICD-10-CM | POA: Diagnosis not present

## 2016-02-19 DIAGNOSIS — R1013 Epigastric pain: Secondary | ICD-10-CM | POA: Insufficient documentation

## 2016-02-19 DIAGNOSIS — K219 Gastro-esophageal reflux disease without esophagitis: Secondary | ICD-10-CM | POA: Diagnosis not present

## 2016-02-19 DIAGNOSIS — F17200 Nicotine dependence, unspecified, uncomplicated: Secondary | ICD-10-CM | POA: Diagnosis not present

## 2016-02-19 MED ORDER — ALBUTEROL SULFATE HFA 108 (90 BASE) MCG/ACT IN AERS: 2.0000 | INHALATION_SPRAY | RESPIRATORY_TRACT | Status: DC | PRN

## 2016-02-19 MED ORDER — PANTOPRAZOLE SODIUM 40 MG PO TBEC: 40.0000 mg | DELAYED_RELEASE_TABLET | Freq: Every day | ORAL | Status: DC

## 2016-02-19 NOTE — Assessment & Plan Note (Addendum)
Doing well. Breathing well. Using albuterol as needed. Has mild wheezing on exam but overall good resp status.  Asked to continue using prn albuterol and to cont his attempt to quit smoking.   Gave pneum-13 shot today

## 2016-02-19 NOTE — Assessment & Plan Note (Signed)
Having pain with some burning sensation, usually in empty stomach, resolves after eating. Having low appetite due to this.  This could be 2/2 to GERD. Will do protonix 40mg  bid for 1 month and then once a day after that.
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
## 2016-02-19 NOTE — Assessment & Plan Note (Signed)
Has callus on right heel that's interfering with walking. Will refer to podiatry.

## 2016-02-19 NOTE — Patient Instructions (Signed)
Please use vaseline for dry hand.  Use albuterol as needed for COPD.  Continue to cut down smoking.  Take protonix twice a day for 1 month, then once a day after that. Take it 30 mins before meals.   Follow up with us in 3 months.   Referred you to podiatry for heel callus.

## 2016-02-20 NOTE — Progress Notes (Signed)
Medicine attending: Medical history, presenting problems, physical findings, and medications, reviewed with resident physician Dr Tasrif Ahmed on the day of the patient visit and I concur with his evaluation and management plan. 

## 2016-03-11 ENCOUNTER — Encounter: Payer: Self-pay | Admitting: Podiatry

## 2016-03-11 ENCOUNTER — Ambulatory Visit (INDEPENDENT_AMBULATORY_CARE_PROVIDER_SITE_OTHER): Payer: Medicare HMO | Admitting: Podiatry

## 2016-03-11 VITALS — BP 121/87 | HR 81 | Resp 16 | Ht 74.0 in | Wt 165.0 lb

## 2016-03-11 DIAGNOSIS — M722 Plantar fascial fibromatosis: Secondary | ICD-10-CM

## 2016-03-11 DIAGNOSIS — L84 Corns and callosities: Secondary | ICD-10-CM

## 2016-03-11 MED ORDER — TRIAMCINOLONE ACETONIDE 10 MG/ML IJ SUSP
10.0000 mg | Freq: Once | INTRAMUSCULAR | Status: AC
  Administered 2016-03-11: 10 mg

## 2016-03-11 NOTE — Progress Notes (Signed)
   Subjective:    Patient ID: Casey Ryan, male    DOB: 1951/02/04, 65 y.o.   MRN: [id number]  HPI Chief Complaint  Patient presents with  . Painful lesion    Left foot; heel; x3-4 months      Review of Systems  All other systems reviewed and are negative.      Objective:   Physical Exam        Assessment & Plan:

## 2016-03-11 NOTE — Progress Notes (Signed)
Subjective:     Patient ID: Casey Ryan, male   DOB: 1951/01/17, 65 y.o.   MRN: [id number]  HPI patient presents with painful lesion on the plantar aspect of the left heel with lesion formation and pain when walking. Patient has tried trimming and padding without relief and also feels like the tendon is inflamed   Review of Systems  All other systems reviewed and are negative.      Objective:   Physical Exam  Constitutional: He is oriented to person, place, and time.  Cardiovascular: Intact distal pulses.   Musculoskeletal: Normal range of motion.  Neurological: He is oriented to person, place, and time.  Skin: Skin is warm and dry.  Nursing note and vitals reviewed.  Neurovascular status was found to be intact with muscle strength adequate and range of motion mildly reduced subtalar midtarsal joint. There is mild equinus and there is noted to be quite a bit of discomfort plantar aspect left heel with inflammation and a keratotic lesion on the plantar posterior portion of the left heel that sore when palpated. Patient is noted to have good digital perfusion is well oriented 3     Assessment:     Inflammatory fasciitis plantar heel left along with porokeratotic type deep lesion    Plan:     H&P and condition reviewed with patient. Careful cortisone injection administered 3 mg Kenalog 5 mg Xylocaine to the plantar fascia and debridement was accomplished with patient to be seen back when symptomatic

## 2016-03-12 ENCOUNTER — Telehealth: Payer: Self-pay | Admitting: Licensed Clinical Social Worker

## 2016-03-12 NOTE — Telephone Encounter (Signed)
Thanks for your work trying to find [name] sooner psychiatric follow up.

## 2016-03-12 NOTE — Telephone Encounter (Signed)
CSW returned call to Casey Ryan.  Pt states he had a psychiatric appointment set up for several months and it has been rescheduled for September.  Casey Ryan states he needs to be seen urgently.  CSW inquired if pt was at risk for hurting himself or anyone else, "I just need to see a psychiatrist and I was told you could get me in sooner."  Pt declined [facility] stating he could wait all day without being seen.  CSW inquired if Casey Ryan would provide this worker with his location for a mobile crisis to come to him, pt states "I can take care of myself."  Pt agreeable to allow this worker to contact several agencies with an attempt to obtain an appointment.  Call [facility] currently [facility] but options of [facility] and [facility] provided.  [facility] scheduling out 4 weeks and message left with [facility].  CSW called patient to provide update and inquire if pt was aware of [facility].  CSW provided Casey Ryan with [facility] with [facility]-in hours.  Pt instructed if he is in crisis to [facility] will forward to triage RN and PCP.

## 2016-03-16 ENCOUNTER — Telehealth: Payer: Self-pay | Admitting: Licensed Clinical Social Worker

## 2016-03-16 NOTE — Telephone Encounter (Signed)
CSW placed called to pt.  CSW left message requesting return call. CSW provided contact hours for today.  Pt notified of scheduled appointment at [facility].  Message left provided contact information and address, appt date/time with request to arrive 15 min prior to appt.

## 2016-03-16 NOTE — Telephone Encounter (Signed)
CSW returned call to Mr. Taussig.  CSW inquired if pt was able to be seen at [facility] on 03/12/16.  Mr. Gable indicated he has looked into another office in [place] and requesting this worker to fax current notes to Dr. Garnetta Buddy at [phone].  Pt has not scheduled with this office.  CSW discussed option of [facility] scheduling and inquired if pt would like to continue with [employer] agency or [employer].  Pt's preference would be [facility].  Mr. Corpe states he is in a meeting right now and will need to contact this worker at a later time for agency information. CSW contacted [facility] at [phone].  Appointment available today and tomorrow 03/17/16 at 10:00am with Dr. Donell Beers.  Address: [address], [place], [place] [address].  CSW requested Mr. Thron to be scheduled for Wednesday 03/17/16 at 10:00am.

## 2016-03-17 ENCOUNTER — Ambulatory Visit (HOSPITAL_COMMUNITY): Payer: Commercial Managed Care - HMO | Admitting: Psychiatry

## 2016-03-23 ENCOUNTER — Telehealth: Payer: Self-pay | Admitting: Licensed Clinical Social Worker

## 2016-03-23 NOTE — Telephone Encounter (Signed)
CSW attempted to contact Casey Ryan to inquire if pt was able to maintain an urgent appointment scheduled by this worker on 03/17/16 at [facility].  Unable to leave message. CSW placed call to [facility] - pt no-showed appointment

## 2016-04-06 ENCOUNTER — Telehealth: Payer: Self-pay | Admitting: Licensed Clinical Social Worker

## 2016-04-06 NOTE — Telephone Encounter (Signed)
CSW received incoming call from [name].  Pt states "I received a call from you.  I called the place on [address] and they don't have me scheduled for an appointment".  CSW informed [name], CSW had scheduled appointment on 03/17/16 and provided all information including date/time, address and contact information.  CSW placed f/u call to [facility] and pt no-showed appointment.  Pt states he got the message late.  Pt states [place] [facility] [place].  CSW informed [name], he is able to choose his provider and appointment.  This worker unaware of any scheduled appointments with psychiatry at this time.  Pt notified if he does not want to go to [facility], he is able to contact [facility] directly for scheduling.

## 2016-04-06 NOTE — Telephone Encounter (Signed)
CSW received call from Mr. Hyman BibleBattles.  Pt states he has contacted an agency in [place] and is able to be scheduled.  Mr. Hyman BibleBattles could not remember name of agency but did have fax number available.  Pt requesting CSW to fax chart information to [phone].  Per Mr. Hyman BibleBattles request, CSW faxed demographic and referral to fax number listed above.

## 2016-04-21 ENCOUNTER — Encounter: Payer: Self-pay | Admitting: Psychiatry

## 2016-04-21 ENCOUNTER — Ambulatory Visit (INDEPENDENT_AMBULATORY_CARE_PROVIDER_SITE_OTHER): Payer: Medicare HMO | Admitting: Psychiatry

## 2016-04-21 VITALS — BP 111/76 | HR 54 | Temp 97.5°F | Ht 74.0 in | Wt 145.2 lb

## 2016-04-21 DIAGNOSIS — F1010 Alcohol abuse, uncomplicated: Secondary | ICD-10-CM

## 2016-04-21 DIAGNOSIS — F4323 Adjustment disorder with mixed anxiety and depressed mood: Secondary | ICD-10-CM

## 2016-04-21 MED ORDER — BUSPIRONE HCL 5 MG PO TABS: 10.0000 mg | ORAL_TABLET | Freq: Two times a day (BID) | ORAL | Status: DC

## 2016-04-21 NOTE — Progress Notes (Signed)
Psychiatric Initial Adult Assessment   Patient Identification: Casey Ryan MRN:  [id number] Date of Evaluation:  04/21/2016 Ref[facility] Chief Complaint:   Chief Complaint    Establish Care; Anxiety     Visit Diagnosis:    ICD-9-CM ICD-10-CM   1. Adjustment disorder with mixed anxiety and depressed mood 309.28 F43.23   2. Alcohol abuse 305.00 F10.10     History of Present Illness:    Patient is a 65 year old male who presented for initial assessment. He was referred by the [facility] office. He reported that he came here to establish care. He reported that he wants to  be diagnosed with PTSD as he has served in [employer] in the war zone in 1970. He stated that he has been trying to see a psychiatrist for many years. He has been attending meetings in the [place] in [place] in the self-help groups. He has also seeing counselors and attending group meetings in the vet center in [place] and in the [place] area. He reported that he has bad conduct discharge from the military and he is trying to reverse that. Patient was focused on his evaluation and he reported that finally he is able to see a psychiatrist and he wants the evaluation done so he can get his diagnosis.  He reported that he has never taken any medications for depression and anxiety or PTSD and has seen several psychiatrists in the past in the [place] area but was minimizing and he stated that couple of times he was referred by the [employer] but does not want to tell me his diagnosis. When I asked him as less clearly he stopped and after a while he replied that he was diagnosed with PTSD. He appeared manipulative during the interview. Patient reported that he also went to [facility] in [place] since he has to wait there for half a day to be seen by a psychiatrist he stopped going there.  He also had an appointment at [facility] next month but he does not wait any longer and since he got an early  appointment in this office he decided to come here.  He reported that he has been drinking on a regular basis and has history of DWI in the past.  Associated Signs/Symptoms: Depression Symptoms:  depressed mood, insomnia, fatigue, anxiety, loss of energy/fatigue, weight loss, (Hypo) Manic Symptoms:  Labiality of Mood, Anxiety Symptoms:  Excessive Worry, Psychotic Symptoms:  none PTSD Symptoms: Negative NA  Past Psychiatric History:   Patient reported that he has been attending groups therapy counseling and has been evaluated by psychiatrists in the past. He reported that he has been going to the vet center in [place] and [place] area.  He denied any previous history of suicide attempts.   Previous Psychotropic Medications: Patient denied using any medications  in the past  Substance Abuse History in the last 12 months:  Yes.    2-3 drinks yesterday  Consequences of Substance Abuse: Legal Consequences:  DWI x 3- last few years ago Blackouts:  sweats. Withdrawal Symptoms:   Headaches Nausea Tremors   Past Medical History:  Past Medical History:  Diagnosis Date  . Asthma   . Chronic cough 08/01/2015   rx for TB in 1996 [facility] [place]  - allergy profile 12/76/16  IgE 243/ min yeast RAST    . COPD GOLD II with mild reversiblity  09/17/2015   PFT's  09/16/2015   FEV1 1.77 (52 % ) ratio 51  p 15 %  improvement from saba with DLCO  54 % corrects to 72 % for alv volume on no rx previsit   . Tuberculosis 1996   [place] [facility]   History reviewed. No pertinent surgical history.  Family Psychiatric History:  Denied  Family History: History reviewed. No pertinent family history.  Social History:   Social History   Social History  . Marital status: Single    Spouse name: N/A  . Number of children: N/A  . Years of education: N/A   Social History Main Topics  . Smoking status: Current Every Day Smoker    Packs/day: 1.00    Years: 50.00    Types:  Cigarettes    Start date: 08/23/1964    Last attempt to quit: 10/07/2015  . Smokeless tobacco: Never Used     Comment: Cutting back  . Alcohol use 3.6 oz/week    6 Cans of beer per week     Comment: occasionally  . Drug use: No  . Sexual activity: Not Currently    Birth control/ protection: None   Other Topics Concern  . 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   Lives with Wife, no children    Additional Social History:  'Airline pilotsales and service job". [employer]  With [employer]  Lives with friend.  Married x 7 years. Now divorced No children.   Allergies:  No Known Allergies  Metabolic Disorder Labs: No results found for: HGBA1C, MPG No results found for: PROLACTIN Lab Results  Component Value Date   CHOL 156 08/01/2015   TRIG 52.0 08/01/2015   HDL 61.10 08/01/2015   CHOLHDL 3 08/01/2015   VLDL 10.4 08/01/2015   LDLCALC 84 08/01/2015     Current Medications: Current Outpatient Prescriptions  Medication Sig Dispense Refill  . albuterol (PROVENTIL HFA;VENTOLIN HFA) 108 (90 Base) MCG/ACT inhaler Inhale 2 puffs into the lungs every 4 (four) hours as needed for wheezing or shortness of breath. 1 Inhaler 0  . Multiple Vitamin (MULTIVITAMIN WITH MINERALS) TABS tablet Take 1 tablet by mouth daily. Centrum Silver    . nicotine polacrilex (CVS NICOTINE POLACRILEX) 2 MG gum Take 1 each (2 mg total) by mouth as needed for smoking cessation. (Patient taking differently: Take 2 mg by mouth 4 (four) times daily as needed for smoking cessation. ) 100 tablet 1  . pantoprazole (PROTONIX) 40 MG tablet Take 1 tablet (40 mg total) by mouth daily. 60 tablet 1   Current Facility-Administered Medications  Medication Dose Route Frequency Provider Last Rate Last Dose  . busPIRone (BUSPAR) tablet 10 mg  10 mg Oral BID Brandy HaleUzma Shanty Ginty, MD        Neurologic: Headache: No Seizure:  No Paresthesias:No  Musculoskeletal: Strength & Muscle Tone: within normal limits Gait & Station: normal Patient leans: N/A  Psychiatric Specialty Exam: Review of Systems  Constitutional: Positive for malaise/fatigue and weight loss.  Respiratory: Positive for cough and shortness of breath.   Neurological: Positive for dizziness.  Psychiatric/Behavioral: Positive for depression and substance abuse. The patient has insomnia.   All other systems reviewed and are negative.   Blood pressure 111/76, pulse (!) 54, temperature 97.5 F (36.4 C), temperature source Oral, height 6\' 2"  (1.88 m), weight 145 lb 3.2 oz (65.9 kg).Body mass index is 18.64 kg/m.  General Appearance: Casual  Eye Contact:  Fair  Speech:  Clear and Coherent  Volume:  Decreased  Mood:  Anxious  Affect:  Blunt  Thought Process:  Goal Directed  Orientation:  Full (Time, Place, and Person)  Thought Content:  Logical  Suicidal Thoughts:  No  Homicidal Thoughts:  No  Memory:  Immediate;   Fair Recent;   Fair Remote;   Fair  Judgement:  Impaired  Insight:  Lacking  Psychomotor Activity:  Normal  Concentration:  Concentration: Fair and Attention Span: Fair  Recall:  [employer] of Knowledge:Fair  Language: Fair  Akathisia:  No  Handed:  Right  AIMS (if indicated):    Assets:  Communication Skills Housing Intimacy  ADL's:  Intact  Cognition: WNL  Sleep:     Treatment Plan Summary: Medication management   Discussed with patient at length that I will not be able to do any evaluations for the military including diagnosing him for PTSD as it needs specialized evaluation. He needs to find another psychiatrist who are recruited  by the military for his evaluation and to diagnose him correctly and to help him with his service connection. Patient stated that he wants referral  to another psychiatrist who will take his insurance and help him with this diagnosis.  We'll provide him with this information.  I will  start him on BuSpar 10 mg twice a day to help with his anxiety  He agreed with the plan.  No follow-up appointments will be made at this time.   Brandy Hale, MD 8/30/201711:48 AM

## 2016-04-23 ENCOUNTER — Ambulatory Visit (HOSPITAL_COMMUNITY): Payer: Medicare HMO | Admitting: Psychiatry

## 2016-05-14 ENCOUNTER — Telehealth: Payer: Self-pay | Admitting: Student in an Organized Health Care Education/Training Program

## 2016-05-14 NOTE — Telephone Encounter (Signed)
APT. REMINDER CALL. NO ANSWER, NO VOICEMAIL

## 2016-05-16 ENCOUNTER — Emergency Department (HOSPITAL_COMMUNITY)
Admission: EM | Admit: 2016-05-16 | Discharge: 2016-05-16 | Disposition: A | Discharge status: Home / Self Care | Payer: Medicare HMO | Attending: Emergency Medicine | Admitting: Emergency Medicine

## 2016-05-16 ENCOUNTER — Encounter (HOSPITAL_COMMUNITY): Payer: Self-pay

## 2016-05-16 ENCOUNTER — Emergency Department (HOSPITAL_COMMUNITY): Payer: Medicare HMO

## 2016-05-16 DIAGNOSIS — J441 Chronic obstructive pulmonary disease with (acute) exacerbation: Secondary | ICD-10-CM | POA: Diagnosis not present

## 2016-05-16 DIAGNOSIS — F17210 Nicotine dependence, cigarettes, uncomplicated: Secondary | ICD-10-CM | POA: Insufficient documentation

## 2016-05-16 DIAGNOSIS — R079 Chest pain, unspecified: Secondary | ICD-10-CM

## 2016-05-16 DIAGNOSIS — R110 Nausea: Secondary | ICD-10-CM | POA: Diagnosis present

## 2016-05-16 LAB — LIPASE, BLOOD: Lipase: 43 U/L (ref 11–51)

## 2016-05-16 LAB — URINE MICROSCOPIC-ADD ON

## 2016-05-16 LAB — CBC
HCT: 50.4 % (ref 39.0–52.0)
Hemoglobin: 17.5 g/dL — ABNORMAL HIGH (ref 13.0–17.0)
MCH: 32.5 pg (ref 26.0–34.0)
MCHC: 34.7 g/dL (ref 30.0–36.0)
MCV: 93.5 fL (ref 78.0–100.0)
PLATELETS: 191 10*3/uL (ref 150–400)
RBC: 5.39 MIL/uL (ref 4.22–5.81)
RDW: 12.7 % (ref 11.5–15.5)
WBC: 13.7 10*3/uL — ABNORMAL HIGH (ref 4.0–10.5)

## 2016-05-16 LAB — URINALYSIS, ROUTINE W REFLEX MICROSCOPIC
BILIRUBIN URINE: NEGATIVE
Glucose, UA: NEGATIVE mg/dL
HGB URINE DIPSTICK: NEGATIVE
KETONES UR: 15 mg/dL — AB
Nitrite: NEGATIVE
PROTEIN: 100 mg/dL — AB
Specific Gravity, Urine: 1.01 (ref 1.005–1.030)
pH: 8 (ref 5.0–8.0)

## 2016-05-16 LAB — COMPREHENSIVE METABOLIC PANEL
ALBUMIN: 4.4 g/dL (ref 3.5–5.0)
ALK PHOS: 67 U/L (ref 38–126)
ALT: 32 U/L (ref 17–63)
AST: 44 U/L — ABNORMAL HIGH (ref 15–41)
Anion gap: 13 (ref 5–15)
BILIRUBIN TOTAL: 1.1 mg/dL (ref 0.3–1.2)
BUN: 16 mg/dL (ref 6–20)
CALCIUM: 9.9 mg/dL (ref 8.9–10.3)
CO2: 28 mmol/L (ref 22–32)
CREATININE: 1.05 mg/dL (ref 0.61–1.24)
Chloride: 95 mmol/L — ABNORMAL LOW (ref 101–111)
GFR calc non Af Amer: >60 mL/min (ref >60)
GLUCOSE: 115 mg/dL — AB (ref 65–99)
Potassium: 4.1 mmol/L (ref 3.5–5.1)
SODIUM: 136 mmol/L (ref 135–145)
Total Protein: 8.2 g/dL — ABNORMAL HIGH (ref 6.5–8.1)

## 2016-05-16 MED ORDER — AZITHROMYCIN 500 MG IV SOLR
500.0000 mg | Freq: Once | INTRAVENOUS | Status: AC
  Administered 2016-05-16: 500 mg via INTRAVENOUS
  Filled 2016-05-16: qty 500

## 2016-05-16 MED ORDER — ONDANSETRON HCL 4 MG/2ML IJ SOLN
4.0000 mg | Freq: Once | INTRAMUSCULAR | Status: AC
Start: 2016-05-16 — End: 2016-05-16
  Administered 2016-05-16: 4 mg via INTRAVENOUS
  Filled 2016-05-16: qty 2

## 2016-05-16 MED ORDER — METHYLPREDNISOLONE SODIUM SUCC 125 MG IJ SOLR
125.0000 mg | Freq: Once | INTRAMUSCULAR | Status: AC
  Administered 2016-05-16: 125 mg via INTRAVENOUS
  Filled 2016-05-16: qty 2

## 2016-05-16 MED ORDER — PREDNISONE 10 MG PO TABS: 40.0000 mg | ORAL_TABLET | Freq: Every day | ORAL | 0 refills | Status: AC

## 2016-05-16 MED ORDER — ALBUTEROL (5 MG/ML) CONTINUOUS INHALATION SOLN
5.0000 mg/h | INHALATION_SOLUTION | Freq: Once | RESPIRATORY_TRACT | Status: AC
  Administered 2016-05-16: 5 mg/h via RESPIRATORY_TRACT
  Filled 2016-05-16: qty 20

## 2016-05-16 MED ORDER — AZITHROMYCIN 250 MG PO TABS: 250.0000 mg | ORAL_TABLET | Freq: Every day | ORAL | 0 refills | Status: AC

## 2016-05-16 MED ORDER — IPRATROPIUM-ALBUTEROL 0.5-2.5 (3) MG/3ML IN SOLN
3.0000 mL | RESPIRATORY_TRACT | Status: AC
  Administered 2016-05-16: 3 mL via RESPIRATORY_TRACT
  Filled 2016-05-16: qty 3

## 2016-05-16 MED ORDER — SODIUM CHLORIDE 0.9 % IV BOLUS (SEPSIS)
1000.0000 mL | Freq: Once | INTRAVENOUS | Status: AC
  Administered 2016-05-16: 1000 mL via INTRAVENOUS

## 2016-05-16 NOTE — ED Provider Notes (Addendum)
[facility]-EMERGENCY DEPT Provider Note   CSN: [id number] Arrival date & time: 05/16/16  1144     History   Chief Complaint Chief Complaint  Patient presents with  . Nausea    HPI Casey Ryan is a 65 y.o. male.  The history is provided by the patient.  Cough  This is a recurrent problem. The current episode started more than 1 week ago. The problem occurs every few minutes. The cough is non-productive. There has been no fever. Associated symptoms include headaches (only with coughing), rhinorrhea, shortness of breath and wheezing. Pertinent negatives include no chest pain, no chills, no ear pain and no sore throat. He is not a smoker (quit 3 days ago). His past medical history is significant for COPD.    Past Medical History:  Diagnosis Date  . Asthma   . Chronic cough 08/01/2015   rx for TB in 1996 [facility] [place]  - allergy profile 12/76/16  IgE 243/ min yeast RAST    . COPD GOLD II with mild reversiblity  09/17/2015   PFT's  09/16/2015   FEV1 1.77 (52 % ) ratio 51  p 15 % improvement from saba with DLCO  54 % corrects to 72 % for alv volume on no rx previsit   . Tuberculosis 1996   Completed treatment in [facility], [place]    Patient Active Problem List   Diagnosis Date Noted  . GERD (gastroesophageal reflux disease) 02/19/2016  . Heel callus 02/19/2016  . PTSD (post-traumatic stress disorder) 12/16/2015  . Arthropathy 12/16/2015  . Weak urinary stream 10/11/2015  . COPD GOLD II with mild reversiblity  09/17/2015  . Abnormal CXR 08/02/2015  . Polycythemia, secondary 08/02/2015  . Cigarette smoker 08/02/2015  . Chronic cough 08/01/2015  . Hyperlipidemia 08/01/2015  . PPD positive, treated 12/28/2013    History reviewed. No pertinent surgical history.     Home Medications    Prior to Admission medications   Medication Sig Start Date End Date Taking? Authorizing Provider  albuterol (PROVENTIL HFA;VENTOLIN HFA) 108 (90 Base) MCG/ACT inhaler Inhale 2 puffs into the lungs  every 4 (four) hours as needed for wheezing or shortness of breath. 02/19/16  Yes Tasrif Ahmed, MD  azithromycin (ZITHROMAX) 250 MG tablet Take 1 tablet (250 mg total) by mouth daily. Take first 2 tablets together, then 1 every day until finished. 05/17/16 05/21/16  Nira Conn, MD  Multiple Vitamin (MULTIVITAMIN WITH MINERALS) TABS tablet Take 1 tablet by mouth daily. Centrum Silver    Historical Provider, MD  nicotine polacrilex (CVS NICOTINE POLACRILEX) 2 MG gum Take 1 each (2 mg total) by mouth as needed for smoking cessation. Patient not taking: Reported on 05/16/2016 10/21/15   Gust Rung, DO  pantoprazole (PROTONIX) 40 MG tablet Take 1 tablet (40 mg total) by mouth daily. Patient not taking: Reported on 05/16/2016 02/19/16 02/18/17  Hyacinth Meeker, MD  predniSONE (DELTASONE) 10 MG tablet Take 4 tablets (40 mg total) by mouth daily. 05/17/16 05/21/16  Nira Conn, MD    Family History No family history on file.  Social History Social History  Substance Use Topics  . Smoking status: Current Every Day Smoker    Packs/day: 1.00    Years: 50.00    Types: Cigarettes    Start date: 08/23/1964    Last attempt to quit: 10/07/2015  . Smokeless tobacco: Never Used     Comment: Cutting back  . Alcohol use 3.6 oz/week    6 Cans of beer per  week     Comment: occasionally     Allergies   Review of patient's allergies indicates no known allergies.   Review of Systems Review of Systems  Constitutional: Negative for chills and fever.  HENT: Positive for congestion and rhinorrhea. Negative for ear pain and sore throat.   Eyes: Negative for pain and visual disturbance.  Respiratory: Positive for cough, shortness of breath and wheezing.   Cardiovascular: Negative for chest pain and palpitations.  Gastrointestinal: Positive for nausea and vomiting (post tussive). Negative for abdominal pain.  Genitourinary: Negative for dysuria and hematuria.  Musculoskeletal: Negative for  arthralgias and back pain.  Skin: Negative for color change and rash.  Neurological: Positive for headaches (only with coughing). Negative for seizures and syncope.  All other systems reviewed and are negative.    Physical Exam Updated Vital Signs BP (!) 145/103 (BP Location: Right Arm)   Pulse 72   Temp 99.5 F (37.5 C) (Oral)   Resp 24   Ht 6\' 2"  (1.88 m)   Wt 150 lb (68 kg)   SpO2 96%   BMI 19.26 kg/m   Physical Exam  Constitutional: He is oriented to person, place, and time. He appears well-developed and well-nourished. No distress.  HENT:  Head: Normocephalic and atraumatic.  Nose: Nose normal.  Eyes: Conjunctivae and EOM are normal. Pupils are equal, round, and reactive to light. Right eye exhibits no discharge. Left eye exhibits no discharge. No scleral icterus.  Neck: Normal range of motion. Neck supple.  Cardiovascular: Normal rate and regular rhythm.  Exam reveals no gallop and no friction rub.   No murmur heard. Pulmonary/Chest: Effort normal. No stridor. No respiratory distress. He has wheezes. He has no rales.  Abdominal: Soft. He exhibits no distension. There is no tenderness.  Musculoskeletal: He exhibits no edema or tenderness.  Neurological: He is alert and oriented to person, place, and time.  Skin: Skin is warm and dry. No rash noted. He is not diaphoretic. No erythema.  Psychiatric: He has a normal mood and affect.  Vitals reviewed.    ED Treatments / Results  Labs (all labs ordered are listed, but only abnormal results are displayed) Labs Reviewed  COMPREHENSIVE METABOLIC PANEL - Abnormal; Notable for the following:       Result Value   Chloride 95 (*)    Glucose, Bld 115 (*)    Total Protein 8.2 (*)    AST 44 (*)    All other components within normal limits  CBC - Abnormal; Notable for the following:    WBC 13.7 (*)    Hemoglobin 17.5 (*)    All other components within normal limits  URINALYSIS, ROUTINE W REFLEX MICROSCOPIC (NOT AT [facility]) -  Abnormal; Notable for the following:    Color, Urine AMBER (*)    APPearance CLOUDY (*)    Ketones, ur 15 (*)    Protein, ur 100 (*)    Leukocytes, UA TRACE (*)    All other components within normal limits  URINE MICROSCOPIC-ADD ON - Abnormal; Notable for the following:    Squamous Epithelial / LPF 0-5 (*)    Bacteria, UA FEW (*)    Casts HYALINE CASTS (*)    All other components within normal limits  LIPASE, BLOOD    EKG  EKG Interpretation None       Radiology Dg Chest 2 View  Result Date: 05/16/2016 CLINICAL DATA:  Fever with chest pain EXAM: CHEST  2 VIEW COMPARISON:  Jan 17, 2016 chest radiograph and chest CT FINDINGS: Lungs are hyperexpanded. There is scarring in the right upper lobe. The lungs elsewhere are clear. Heart size and pulmonary vascularity are normal. No adenopathy. No bone lesions. No pneumothorax. IMPRESSION: Lungs hyperexpanded with scarring right upper lobe. No edema or consolidation. Stable cardiac silhouette. Electronically Signed   By: Bretta Bang III M.D.   On: 05/16/2016 16:27    Procedures Procedures (including critical care time)  Medications Ordered in ED Medications  ipratropium-albuterol (DUONEB) 0.5-2.5 (3) MG/3ML nebulizer solution 3 mL (3 mLs Nebulization Given 05/16/16 1639)  azithromycin (ZITHROMAX) 500 mg in dextrose 5 % 250 mL IVPB (0 mg Intravenous Stopped 05/16/16 2004)  methylPREDNISolone sodium succinate (SOLU-MEDROL) 125 mg/2 mL injection 125 mg (125 mg Intravenous Given 05/16/16 1639)  ondansetron (ZOFRAN) injection 4 mg (4 mg Intravenous Given 05/16/16 1639)  sodium chloride 0.9 % bolus 1,000 mL (0 mLs Intravenous Stopped 05/16/16 1749)  albuterol (PROVENTIL,VENTOLIN) solution continuous neb (5 mg/hr Nebulization Given 05/16/16 1751)     Initial Impression / Assessment and Plan / ED Course  I have reviewed the triage vital signs and the nursing notes.  Pertinent labs & imaging results that were available during my care of the  patient were reviewed by me and considered in my medical decision making (see chart for details).  Clinical Course    COPD exacerbation. Given steroids and azithromycin as well as a breathing treatments. Patient was significant improvement in air movement and symptomatology. Chest x-ray without lobar pneumonia.  Aneta Mins patient is safe for discharge with strict return precautions. Patient provided with prescription for Zithromax and prednisone.   Final Clinical Impressions(s) / ED Diagnoses   Final diagnoses:  COPD exacerbation (HCC)   Disposition: Discharge  Condition: Good  I have discussed the results, Dx and Tx plan with the patient who expressed understanding and agree(s) with the plan. Discharge instructions discussed at great length. The patient was given strict return precautions who verbalized understanding of the instructions. No further questions at time of discharge.    Discharge Medication List as of 05/16/2016  8:16 PM    START taking these medications   Details  azithromycin (ZITHROMAX) 250 MG tablet Take 1 tablet (250 mg total) by mouth daily. Take first 2 tablets together, then 1 every day until finished., Starting Mon 05/17/2016, Until Fri 05/21/2016, Print    predniSONE (DELTASONE) 10 MG tablet Take 4 tablets (40 mg total) by mouth daily., Starting Mon 05/17/2016, Until Fri 05/21/2016, Print        Follow Up: Tyson Alias, MD [address] [place] [address] [phone]  Schedule an appointment as soon as possible for a visit  in 3-5 days, If symptoms do not improve or  worsen        Nira Conn, MD 05/16/16 2355    Nira Conn, MD 06/20/16 [phone]

## 2016-05-16 NOTE — ED Notes (Signed)
Gave pt a urinal and informed him we need a urine sample. Pt says he doesn't need to use the bathroom at this time.

## 2016-05-16 NOTE — ED Notes (Signed)
Patient is alert and orientedx4.  Patient was explained discharge instructions and they understood them with no questions.   

## 2016-05-16 NOTE — ED Notes (Signed)
Patient transported to X-ray 

## 2016-05-16 NOTE — ED Triage Notes (Signed)
Pt here with c/o abdominal pain, N/V and subjective fever, emesis x 2 this morning.

## 2016-05-17 ENCOUNTER — Encounter: Payer: Self-pay | Admitting: Student in an Organized Health Care Education/Training Program

## 2016-05-17 ENCOUNTER — Ambulatory Visit: Payer: Medicare HMO

## 2016-06-07 ENCOUNTER — Encounter: Payer: Self-pay | Admitting: Student in an Organized Health Care Education/Training Program

## 2016-06-07 ENCOUNTER — Ambulatory Visit (INDEPENDENT_AMBULATORY_CARE_PROVIDER_SITE_OTHER): Payer: Medicare HMO | Admitting: Student in an Organized Health Care Education/Training Program

## 2016-06-07 VITALS — HR 70 | Temp 97.9°F | Ht 74.0 in | Wt 144.4 lb

## 2016-06-07 DIAGNOSIS — R634 Abnormal weight loss: Secondary | ICD-10-CM | POA: Diagnosis not present

## 2016-06-07 DIAGNOSIS — K219 Gastro-esophageal reflux disease without esophagitis: Secondary | ICD-10-CM

## 2016-06-07 DIAGNOSIS — F4310 Post-traumatic stress disorder, unspecified: Secondary | ICD-10-CM

## 2016-06-07 DIAGNOSIS — F17210 Nicotine dependence, cigarettes, uncomplicated: Secondary | ICD-10-CM | POA: Diagnosis not present

## 2016-06-07 DIAGNOSIS — Z681 Body mass index (BMI) 19 or less, adult: Secondary | ICD-10-CM

## 2016-06-07 DIAGNOSIS — R1013 Epigastric pain: Secondary | ICD-10-CM | POA: Diagnosis not present

## 2016-06-07 DIAGNOSIS — J449 Chronic obstructive pulmonary disease, unspecified: Secondary | ICD-10-CM

## 2016-06-07 MED ORDER — PANTOPRAZOLE SODIUM 40 MG PO TBEC: 40.0000 mg | DELAYED_RELEASE_TABLET | Freq: Two times a day (BID) | ORAL | 2 refills | Status: DC

## 2016-06-07 MED ORDER — BUSPIRONE HCL 10 MG PO TABS: 10.0000 mg | ORAL_TABLET | Freq: Two times a day (BID) | ORAL | 2 refills | Status: DC

## 2016-06-07 NOTE — Assessment & Plan Note (Signed)
Symptoms are consistent with peptic ulcer disease. Plan is to do a four week trial of pantoprazole 40mg  bid and reassess his symptoms. I am worried about the mild weight loss component, if his symptoms do not improve in four weeks I will refer him for EGD to rule out a gastric mass.

## 2016-06-07 NOTE — Patient Instructions (Signed)
1. I think you may have a stomach ulcer causing your pain. I prescribed a medicine to take twice a day, it often takes a few weeks to let the stomach heal on in its own.    2. Come back in four weeks and I will check on your progress and weight.

## 2016-06-07 NOTE — Assessment & Plan Note (Signed)
Symptomatically still very anxious. Symptoms seem to be interfering with his daily activities. He saw a psychiatrist with behavioral health in August, Dr. Garnetta BuddyFaheem, who prescribed buspirone. The patient says he took it for a little while but it ran out, now on no medications. He says he has an upcoming psychiatrist appointment with a clinic that is better [facility] so he can treat his possible PTSD with appropriate benefits. Plan is to restart buspirone, I prescribed 10mg  bid for now, and I will reassess his symptoms in four weeks. We will see if he is able to get a stable psychiatrist to take over this care by then.

## 2016-06-07 NOTE — Progress Notes (Signed)
Assessment and Plan:  See Encounters tab for problem-based medical decision making.   __________________________________________________________  HPI:  65 year old man who is here today for complaints of upper abdominal pain. This is been an intermittent issue for many months. He reports that he gets a pain in his stomach, associated with nausea and gas. He is thrown up a few times. Also associated with poor appetite and food aversion. The pain does get better when he eats small amounts, he notices it gets a lot better when he has milk or ice cream. He has used Pepto-Bismol with some improvement but that caused loose stools. Denies any fevers or chills. He does endorse weight loss, about 20 pounds over the last year. He is unclear what other medicines he has tried for this in the past. He says that he took whatever I gave him last time, which is confusing because this is the first time I have seen this patient.   He continues to complain of anxiety and jitteriness throughout the day. He says that he got very upset at his girlfriend earlier this morning because she was slowing him down. He says it that often happens antibodies upset with her throughout the day as results. He saw a psychiatrist with behavioral health back in August and was prescribed buspirone. He says he took it for a time, the prescription ran out, now is not taking anything. He works as a Pharmacologistdoor-to-door salesman of vacuum cleaners, says that it is a stressful job. He lives in an apartment with a roommate.  __________________________________________________________  Problem List: Patient Active Problem List   Diagnosis Date Noted  . Loss of weight 06/07/2016    Priority: High  . Dyspepsia 02/19/2016    Priority: High  . PTSD (post-traumatic stress disorder) 12/16/2015    Priority: High  . COPD GOLD II with mild reversiblity  09/17/2015    Priority: Medium  . Cigarette smoker 08/02/2015    Priority: Medium  . Hyperlipidemia  08/01/2015    Priority: Low    Medications: Reconciled today in Epic __________________________________________________________  Physical Exam:  Vital Signs: Vitals:   06/07/16 0859  BP: (!) (P) 144/100  Pulse: 70  Temp: 97.9 F (36.6 C)  TempSrc: Oral  SpO2: 100%  Weight: 144 lb 6.4 oz (65.5 kg)  Height: 6\' 2"  (1.88 m)    Gen: anxious appearing man Neck: No cervical LAD, No thyromegaly or nodules, No JVD. CV: RRR, no murmurs Pulm: Normal effort, course crackles heard at the RUL Abd: Soft, NT, ND, normal BS.  Ext: Warm, no edema, normal joints Skin: No atypical appearing moles. No rashes

## 2016-06-07 NOTE — Assessment & Plan Note (Signed)
25 pound unintentional weight loss over the last 10 months. His BMI is currently 18.6. This is in the setting of persistent dyspepsia type symptoms which we are going to treat with a trial of high-dose PPI. We'll follow him up in 4 weeks. He is high risk for lung cancer given tobacco use, so if his weight loss persists I will refer for CT of the chest.

## 2016-08-13 ENCOUNTER — Encounter: Payer: Self-pay | Admitting: Student in an Organized Health Care Education/Training Program

## 2016-08-13 ENCOUNTER — Ambulatory Visit: Payer: Medicare HMO

## 2016-09-17 ENCOUNTER — Telehealth: Payer: Self-pay | Admitting: Student in an Organized Health Care Education/Training Program

## 2016-09-17 NOTE — Telephone Encounter (Signed)
APPT. REMINDER CALL, LMTCB °

## 2016-09-20 ENCOUNTER — Telehealth: Payer: Self-pay

## 2016-09-20 ENCOUNTER — Ambulatory Visit (INDEPENDENT_AMBULATORY_CARE_PROVIDER_SITE_OTHER): Payer: Medicare HMO | Admitting: Student in an Organized Health Care Education/Training Program

## 2016-09-20 ENCOUNTER — Encounter: Payer: Self-pay | Admitting: Student in an Organized Health Care Education/Training Program

## 2016-09-20 VITALS — BP 128/74 | HR 88 | Temp 97.9°F | Ht 74.0 in | Wt 155.4 lb

## 2016-09-20 DIAGNOSIS — R1013 Epigastric pain: Secondary | ICD-10-CM

## 2016-09-20 DIAGNOSIS — F4312 Post-traumatic stress disorder, chronic: Secondary | ICD-10-CM | POA: Diagnosis not present

## 2016-09-20 DIAGNOSIS — R634 Abnormal weight loss: Secondary | ICD-10-CM

## 2016-09-20 DIAGNOSIS — Z23 Encounter for immunization: Secondary | ICD-10-CM

## 2016-09-20 DIAGNOSIS — Z0000 Encounter for general adult medical examination without abnormal findings: Secondary | ICD-10-CM | POA: Insufficient documentation

## 2016-09-20 DIAGNOSIS — Z5189 Encounter for other specified aftercare: Secondary | ICD-10-CM | POA: Diagnosis not present

## 2016-09-20 DIAGNOSIS — J449 Chronic obstructive pulmonary disease, unspecified: Secondary | ICD-10-CM

## 2016-09-20 DIAGNOSIS — F17210 Nicotine dependence, cigarettes, uncomplicated: Secondary | ICD-10-CM | POA: Diagnosis not present

## 2016-09-20 DIAGNOSIS — X58XXXD Exposure to other specified factors, subsequent encounter: Secondary | ICD-10-CM

## 2016-09-20 DIAGNOSIS — F4310 Post-traumatic stress disorder, unspecified: Secondary | ICD-10-CM

## 2016-09-20 NOTE — Assessment & Plan Note (Signed)
Symptomatically stable. He actually appears much more comfortable today than 3 months ago. Plan to continue buspirone 10 mg twice a day. He is very interested in following up with psychiatry but is having difficulty with access. He is unwilling to go back to [facility], says that the weight is 2 long. There is no urgency so I referred him to psychiatry, likely behavioral health or [facility].

## 2016-09-20 NOTE — Progress Notes (Signed)
Assessment and Plan:  See Encounters tab for problem-based medical decision making.   __________________________________________________________  HPI:  66 year old man here for follow-up of dyspepsia. Last saw the patient in October when he was having unintentional weight loss associated with upper abdominal discomforts, upper abdominal gas and early satiety. We did a trial of full dose twice a day PPI and I was going to follow him up in 4 weeks. He reports that his abdominal discomfort is much improved, essentially resolved at this point. He's been eating and drinking much better. His energy is good, he continues to work a full-time job without difficulty. He's been taking the buspirone twice a day and reports it's really helping his mood. Has not been able to establish with a psychiatrist through the [facility] and is asking for a local psychiatric referral to better discuss his PTSD.  He is accompanied today by a woman. When I asked who she was, the patient responded "this is the boss." When I asked further if she was a girlfriend, partner, or related, the patient did not answer my question but did indicate that we could do the visit with her.   __________________________________________________________  Problem List: Patient Active Problem List   Diagnosis Date Noted  . PTSD (post-traumatic stress disorder) 12/16/2015    Priority: High  . COPD GOLD II with mild reversiblity  09/17/2015    Priority: Medium  . Cigarette smoker 08/02/2015    Priority: Medium  . Healthcare maintenance 09/20/2016    Priority: Low  . Hyperlipidemia 08/01/2015    Priority: Low    Medications: Reconciled today in Epic __________________________________________________________  Physical Exam:  Vital Signs: Vitals:   09/20/16 0908  BP: 128/74  Pulse: 88  Temp: 97.9 F (36.6 C)  TempSrc: Oral  Weight: 155 lb 6.4 oz (70.5 kg)  Height: 6\' 2"  (1.88 m)    Gen: Well appearing, NAD ENT:  OP clear without erythema or exudate, poor dentition Neck: No cervical LAD, No thyromegaly or nodules, No JVD. CV: RRR, no murmurs Pulm: Normal effort, CTA throughout, no wheezing Abd: Soft, NT, ND.  Ext: Warm, no edema, normal joints Skin: No atypical appearing moles. No rashes

## 2016-09-20 NOTE — Assessment & Plan Note (Signed)
Symptoms of dyspepsia are much improved after 3 months of therapy with full dose twice a day PPI. At this point I think it's okay to stop the PPI and see if his symptoms return. He wants to have his few medications as possible so I think it's okay to hold off on H2 blocker as well. I told him if the symptoms do return to restart the PPI, and we can probably find a low dose once daily regimen to manage his dyspepsia.

## 2016-09-20 NOTE — Telephone Encounter (Signed)
Left voicemail to call back regarding provider preference  for psych referral

## 2016-09-20 NOTE — Assessment & Plan Note (Signed)
Patient is actively trying to cut back on smoking. He does have COPD and I think he recognizes that this is that she mental to his health. I offered him nicotine replacement therapy or pharmacotherapy but he declined. I spent greater than 3 minutes counseling on the importance of tobacco cessation and offering assistance. We will follow up on this issue at her next visit.

## 2016-09-20 NOTE — Patient Instructions (Signed)
1. Stop the Protonix medicine for now, if your stomach returns you can restart it.   2. I will have a stool testing kit sent to your house to test for colon cancer.   3. Keep using the Buspar twice a day for PTSD. I am going to refer you to a psychiatrist.

## 2016-09-20 NOTE — Assessment & Plan Note (Signed)
Her last visit he had lost about 20 pounds unintentionally in the setting of dyspepsia. Since managing his likely PUD with a PPI, his appetite is been much better. His weight is up 9 pounds over the last 3 months. We'll continue to watch this in the future, given his tobacco use he is high risk for having occult malignancy.

## 2016-09-20 NOTE — Assessment & Plan Note (Signed)
We discussed colon cancer screening and he would prefer a stool base test. I completed paperwork for a colo-guard Hemoccult to be mailed to his house with results faxed to our office. We discussed prostate cancer screening, no family history of prostate cancer. I counseled him there is some increased risk given that he is a black American. He has decided against prostate cancer screening at this time and we will monitor for symptoms.

## 2016-09-21 NOTE — Telephone Encounter (Signed)
Call to patient regarding psych referral he does not have a preference [facility]

## 2016-09-22 ENCOUNTER — Other Ambulatory Visit: Payer: Self-pay | Admitting: Student in an Organized Health Care Education/Training Program

## 2016-09-24 NOTE — Telephone Encounter (Signed)
Patient returning your phone call regarding his Referral

## 2016-09-28 ENCOUNTER — Telehealth: Payer: Self-pay | Admitting: Student in an Organized Health Care Education/Training Program

## 2016-09-28 ENCOUNTER — Telehealth: Payer: Self-pay | Admitting: *Deleted

## 2016-09-28 NOTE — Telephone Encounter (Signed)
This was not for an order, see other note from today, Ethelene Brownsanthony was offering assistance w/ billing and education on cologuard used by dr Oswaldo Donevincent

## 2016-09-28 NOTE — Telephone Encounter (Signed)
Call from cologuard rep, offering test reading assist and insurance processing info, if needed field rep is paige at [phone] or call science lab main ph#

## 2016-09-28 NOTE — Telephone Encounter (Signed)
Ethelene Brownsnthony calling from exact science lab about an order

## 2016-10-06 ENCOUNTER — Encounter (HOSPITAL_COMMUNITY): Payer: Self-pay

## 2016-11-01 ENCOUNTER — Ambulatory Visit (HOSPITAL_COMMUNITY): Payer: Medicare HMO | Admitting: Psychiatry

## 2016-11-04 DIAGNOSIS — F17200 Nicotine dependence, unspecified, uncomplicated: Secondary | ICD-10-CM | POA: Diagnosis not present

## 2016-11-04 DIAGNOSIS — J441 Chronic obstructive pulmonary disease with (acute) exacerbation: Secondary | ICD-10-CM | POA: Diagnosis not present

## 2016-11-04 DIAGNOSIS — R05 Cough: Secondary | ICD-10-CM | POA: Diagnosis not present

## 2016-11-04 DIAGNOSIS — R0600 Dyspnea, unspecified: Secondary | ICD-10-CM | POA: Diagnosis not present

## 2016-11-04 DIAGNOSIS — R062 Wheezing: Secondary | ICD-10-CM | POA: Diagnosis not present

## 2016-11-23 ENCOUNTER — Ambulatory Visit (HOSPITAL_COMMUNITY): Payer: Medicare HMO | Admitting: Psychiatry

## 2016-11-29 ENCOUNTER — Ambulatory Visit (HOSPITAL_COMMUNITY): Payer: Medicare HMO | Admitting: Psychiatry

## 2016-12-03 ENCOUNTER — Encounter (INDEPENDENT_AMBULATORY_CARE_PROVIDER_SITE_OTHER): Payer: Self-pay

## 2016-12-03 ENCOUNTER — Encounter (HOSPITAL_COMMUNITY): Payer: Self-pay | Admitting: Psychiatry

## 2016-12-03 ENCOUNTER — Ambulatory Visit (INDEPENDENT_AMBULATORY_CARE_PROVIDER_SITE_OTHER): Payer: Medicare HMO | Admitting: Psychiatry

## 2016-12-03 VITALS — BP 122/74 | HR 96 | Ht 74.0 in | Wt 147.2 lb

## 2016-12-03 DIAGNOSIS — F419 Anxiety disorder, unspecified: Secondary | ICD-10-CM

## 2016-12-03 DIAGNOSIS — Z765 Malingerer [conscious simulation]: Secondary | ICD-10-CM

## 2016-12-03 DIAGNOSIS — Z79899 Other long term (current) drug therapy: Secondary | ICD-10-CM

## 2016-12-03 DIAGNOSIS — F17210 Nicotine dependence, cigarettes, uncomplicated: Secondary | ICD-10-CM

## 2016-12-03 NOTE — Progress Notes (Signed)
Psychiatric Initial Adult Assessment   Patient Identification: Casey Ryan MRN:  [id number] Date of Evaluation:  12/03/2016 Referral Source: PCP Chief Complaint:  "I want to get service connected with the [facility]" Visit Diagnosis:    ICD-9-CM ICD-10-CM   1. Anxiety disorder, unspecified type 300.00 F41.9   2. Malingering V65.2 Z76.5    History of Present Illness:  Casey Ryan is a 66 year old male with no previous psychiatric hospitalizations, no psychiatric history of self-harm, no prior mental health treatment. He presents today for a psychiatric assessment as a referral from his primary care physician for anxiety.   The patient reports that his primary concern is that he wants to get service connected with the [facility] hospital. He reports that he was discharged under "other than honorable conditions" after he stole and purchased illicit drugs while he was in the [employer]. He reports that he was court-martialed and spent approximately 9 months at [facility] prison.  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 He reports he was behind Engineer, site, but he had been exposed to [employer]. He reports that he struggles with COPD. He reports that he has been smoking cigarettes for 40 years.  Regarding his mood, the patient reports that he does okay on a day-to-day basis. 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 He reports that he enjoys his job, and tends to get along with his peers at work. He reports that they sometimes "give me crap" for his age since he is the oldest in the group  But he is able to ignore them and put his frustrations aside. He reports that he generally likes working with people.  In our interaction, the patient appears relaxed, he is able to speak about his military service  without any sense of panic or avoidance. He reports that he saw some gruesome things when he was in his job as an Art gallery manager. He reports that he has occasional dreams and nightmares, but tends to sleep well at night otherwise.  He denies any suicidal thoughts, denies any significant depressive symptoms, denies any problems with anger or violent acting out, does not appear to struggle with any paranoia or hypervigilance.  He is fairly are away from many of his family, but speaks to them on the phone fairly regularly, as they are in [place].  He reports that he had a girlfriend recently, but they aren't together anymore. He is divorced for approximately 15 years. He does not have any children from his marriage or before that.  In terms of future goals, the patient is very hopeful that he can win the sales contest for [employer], so that he can go on a [place] trip for the [employer], which would be compensated by the company. He is also very hopeful that he can get connected with the [facility] for services, and health care.  I spent time thinking the patient for his service in the military, but made clear to him that I do not determine whether or not he would qualify for service. That is generally completed through a compensation examination at the [facility] hospital. He reports that he is working with the [facility] social services in order to see if he can get that examination.  Regarding his mental health needs currently, he  reports that he is doing fine, and that BuSpar 10 mg twice daily has made his symptoms go away. With regard to his symptoms before BuSpar he reports that he would get anxious and worried about work, finances, social situations, and would get GI upset and abdominal cramps. He reports that this is generally gone now. He feels comfortable to follow-up with his primary care physician for further mental health needs. I discussed that there is no need for him to come to this clinic for follow-up unless he  feels that he needs additional help or support.  He was agreeable to this.  During the course of our discussion, patient made a remark, "Some of these guys came back with their arms blew up and I saw that.  I'm lucky that I came back pretty healthy and sane."   Regarding substance use, the patient reports that he continues to use marijuana approximately twice weekly, and drinks approximately 2-3 beers nightly. He does not feel that this is causing a problem for him, and he does not show up to work intoxicated. Regarding legal history, the patient has a history of multiple charges and encounters with the law, and has spent time in civilian prison in addition to [employer] prison.  Of note, he saw doctor Dr. Garnetta Buddy in August 2017 at [facility], requesting to be diagnosed with PTSD, and sharing that he is trying to reverse his bad conduct discharge from the [employer].  During that interview, the patient appeared to be manipulative, and angling for a diagnosis of PTSD. This Clinical research associate read that psychiatric assessment after my independent initial assessment of the patient.  [facility] reviewed. No data in 1 year.  Associated Signs/Symptoms: Depression Symptoms:  none (Hypo) Manic Symptoms:  none Anxiety Symptoms:  none Psychotic Symptoms:  none PTSD Symptoms: none  Past Psychiatric History: Prior psychiatric assessment in August 2017 at [facility]  Previous Psychotropic Medications: Yes   Substance Abuse History in the last 12 months:  Yes.    Consequences of Substance Abuse: Patient continues to use marijuana and alcohol on a regular basis and does not feel that this has caused any problems   Past Medical History:  Past Medical History:  Diagnosis Date  . COPD GOLD II with mild reversiblity  09/17/2015   PFT's  09/16/2015   FEV1 1.77 (52 % ) ratio 51  p 15 % improvement from saba with DLCO  54 % corrects to 72 % for alv volume on no rx previsit   .  Hyperlipidemia 08/01/2015   Referred to primary care    . PTSD (post-traumatic stress disorder) 12/16/2015  . Tuberculosis 1996   Completed treatment in [facility], [place]   History reviewed. No pertinent surgical history.  Family Psychiatric History: None  Family History:  Family History  Problem Relation Age of Onset  . Healthy Mother   . Healthy Father     Social History:   Social History   Social History  . Marital status: Single    Spouse name: N/A  . Number of children: N/A  . Years of education: N/A   Social History Main Topics  . Smoking status: Current Every Day Smoker    Packs/day: 0.50    Years: 50.00    Types: Cigarettes    Start date: 08/23/1964    Last attempt to quit: 10/07/2015  . Smokeless tobacco: Never Used     Comment: Cutting back  . Alcohol use 3.6 oz/week  6 Cans of beer per week     Comment: occasionally  . Drug use: No  . Sexual activity: Not Currently    Birth control/ protection: None   Other Topics Concern  . 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 albuterol (PROVENTIL HFA;VENTOLIN HFA) 108 (90 Base) MCG/ACT inhaler Inhale 2 puffs into the lungs every 4 (four) hours as needed for wheezing or shortness of breath. 1 Inhaler 0  . busPIRone (BUSPAR) 10 MG tablet TAKE 1 TABLET BY MOUTH TWICE A DAY 60 tablet 3  . pantoprazole (PROTONIX) 40 MG tablet     . predniSONE  (DELTASONE) 20 MG tablet      No current facility-administered medications for this visit.     Neurologic: Headache: Negative Seizure: Negative Paresthesias:Negative  Musculoskeletal: Strength & Muscle Tone: within normal limits Gait & Station: normal Patient leans: N/A  Psychiatric Specialty Exam: Review of Systems  Constitutional: Negative.   HENT: Negative.   Respiratory: Negative.   Cardiovascular: Negative.   Gastrointestinal: Negative.   Genitourinary: Negative.   Musculoskeletal: Negative.   Neurological: Negative.   Psychiatric/Behavioral: Negative.     Blood pressure 122/74, pulse 96, height  (1.88 m), weight 147 lb 3.2 oz (66.8 kg).Body mass index is 18.9 kg/m.  General Appearance: Casual, Well Groomed and Wearing a shirt and tie, with polished shoes  Eye Contact:  Good  Speech:  Clear and Coherent  Volume:  Normal  Mood:  Euthymic  Affect:  Appropriate and Congruent  Thought Process:  Goal Directed  Orientation:  Full (Time, Place, and Person)  Thought Content:  Logical  Suicidal Thoughts:  No  Homicidal Thoughts:  No  Memory:  Immediate;   Good  Judgement:  Fair  Insight:  Fair  Psychomotor Activity:  Normal  Concentration:  Concentration: Good  Recall:  NA  Fund of Knowledge:Good  Language: Good  Akathisia:  NA  Handed:  Right  AIMS (if indicated):  na  Assets:  Architect Housing Leisure Time Talents/Skills Transportation  ADL's:  Intact  Cognition: WNL  Sleep:  7-8 hours nightly    Treatment Plan Summary: Niko Penson is a 66 year old veteran, 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   His primary concern appears to be related to being diagnosed with PTSD, and obtaining service connection with the [facility].  I explained to the patient that he would require compensation and assessment examination with a VA physician.   I spent time learning about his current psychiatric needs and psychiatric history, and from our encounter it does not appear that he fills the criteria for PTSD.   He reports some unspecified anxiety, which he asserts to be fully improved with BuSpar 10 mg twice daily.  He presented at our partner office at [facility] for a mental  health assessment in August 2017, and now presents today for repeat assessment. I read my colleagues note after my initial assessment with Mr. Bigley, and it appears that our impressions are aligned.  I believe that Mr. Deniston is malingering to obtain a diagnosis of PTSD for the secondary gain of service connection with the [facility].   On a separate note, if the patient was in fact exposed to agent orange, it would be most appropriate for him to have a medical compensation and assessment examination with the [facility].  He does not require any psychiatric follow-up at this time, and states frankly that his anxiety is under good control with the use of BuSpar.  He does not present any suicidality or homicidality, and does not present with any interest in discontinuing his use of marijuana and alcohol.  1. Anxiety disorder, unspecified type   2. Malingering    Continue BuSpar 10 mg twice daily per primary care No further psychiatric follow-up indicated at this time Patient educated on the appropriate avenue for seeking veteran services   Burnard Leigh, MD 4/13/20188:49 AM

## 2016-12-13 ENCOUNTER — Ambulatory Visit (INDEPENDENT_AMBULATORY_CARE_PROVIDER_SITE_OTHER): Payer: Medicare Other | Admitting: Student in an Organized Health Care Education/Training Program

## 2016-12-13 ENCOUNTER — Encounter: Payer: Self-pay | Admitting: Student in an Organized Health Care Education/Training Program

## 2016-12-13 ENCOUNTER — Encounter (HOSPITAL_COMMUNITY): Payer: Self-pay | Admitting: Emergency Medicine

## 2016-12-13 ENCOUNTER — Emergency Department (HOSPITAL_COMMUNITY)
Admission: EM | Admit: 2016-12-13 | Discharge: 2016-12-13 | Disposition: A | Discharge status: Home / Self Care | Payer: Medicare Other | Attending: Emergency Medicine | Admitting: Emergency Medicine

## 2016-12-13 VITALS — BP 129/91 | HR 66 | Temp 97.7°F | Ht 74.0 in | Wt 150.3 lb

## 2016-12-13 DIAGNOSIS — F411 Generalized anxiety disorder: Secondary | ICD-10-CM

## 2016-12-13 DIAGNOSIS — F419 Anxiety disorder, unspecified: Secondary | ICD-10-CM

## 2016-12-13 DIAGNOSIS — A599 Trichomoniasis, unspecified: Secondary | ICD-10-CM | POA: Diagnosis not present

## 2016-12-13 DIAGNOSIS — R300 Dysuria: Secondary | ICD-10-CM | POA: Diagnosis present

## 2016-12-13 DIAGNOSIS — R1013 Epigastric pain: Secondary | ICD-10-CM | POA: Diagnosis not present

## 2016-12-13 DIAGNOSIS — F17210 Nicotine dependence, cigarettes, uncomplicated: Secondary | ICD-10-CM

## 2016-12-13 DIAGNOSIS — J449 Chronic obstructive pulmonary disease, unspecified: Secondary | ICD-10-CM | POA: Diagnosis not present

## 2016-12-13 LAB — URINALYSIS, ROUTINE W REFLEX MICROSCOPIC
BILIRUBIN URINE: NEGATIVE
Bacteria, UA: NONE SEEN
Glucose, UA: NEGATIVE mg/dL
HGB URINE DIPSTICK: NEGATIVE
Ketones, ur: NEGATIVE mg/dL
NITRITE: NEGATIVE
PH: 5 (ref 5.0–8.0)
Protein, ur: NEGATIVE mg/dL
RBC / HPF: NONE SEEN RBC/hpf (ref 0–5)
SPECIFIC GRAVITY, URINE: 1.006 (ref 1.005–1.030)
Squamous Epithelial / LPF: NONE SEEN

## 2016-12-13 MED ORDER — AZITHROMYCIN 250 MG PO TABS
1000.0000 mg | ORAL_TABLET | Freq: Once | ORAL | Status: AC
  Administered 2016-12-13: 1000 mg via ORAL
  Filled 2016-12-13: qty 4

## 2016-12-13 MED ORDER — PANTOPRAZOLE SODIUM 40 MG PO TBEC: 40.0000 mg | DELAYED_RELEASE_TABLET | Freq: Every day | ORAL | 3 refills | Status: DC

## 2016-12-13 MED ORDER — LIDOCAINE HCL (PF) 1 % IJ SOLN
INTRAMUSCULAR | Status: AC
  Administered 2016-12-13: 13:00:00
  Filled 2016-12-13: qty 5

## 2016-12-13 MED ORDER — CEFTRIAXONE SODIUM 250 MG IJ SOLR
250.0000 mg | Freq: Once | INTRAMUSCULAR | Status: AC
  Administered 2016-12-13: 250 mg via INTRAMUSCULAR
  Filled 2016-12-13: qty 250

## 2016-12-13 MED ORDER — BUSPIRONE HCL 10 MG PO TABS: 10.0000 mg | ORAL_TABLET | Freq: Two times a day (BID) | ORAL | 3 refills | Status: DC

## 2016-12-13 MED ORDER — ONDANSETRON 4 MG PO TBDP
4.0000 mg | ORAL_TABLET | Freq: Once | ORAL | Status: AC
  Administered 2016-12-13: 4 mg via ORAL
  Filled 2016-12-13: qty 1

## 2016-12-13 MED ORDER — METRONIDAZOLE 500 MG PO TABS
2000.0000 mg | ORAL_TABLET | Freq: Once | ORAL | Status: AC
  Administered 2016-12-13: 2000 mg via ORAL
  Filled 2016-12-13: qty 4

## 2016-12-13 NOTE — ED Provider Notes (Signed)
[facility]-EMERGENCY DEPT Provider Note   CSN: [id number] Arrival date & time: 12/13/16  1117   By signing my name below, I, Casey Ryan, attest that this documentation has been prepared under the direction and in the presence of Gwyneth Sprout, MD. Electronically Signed: Soijett Ryan, ED Scribe. 12/13/16. 12:21 PM.  History   Chief Complaint Chief Complaint  Patient presents with  . Exposure to STD    HPI Casey Ryan is a 66 y.o. male who presents to the Emergency Department complaining of exposure to STD onset 1 week ago. He states that he was informed by a sexual partner last week that he needed to be checked for trichomonas. He notes that he has numerous sexual partners that he intermittently uses condoms with. Pt reports associated gradually worsening dysuria x 2 months, HA, and nausea. Pt has not tried any medications for the relief of his symptoms. Denies penile pain, penile discharge, genital lesions, testicular pain, testicular swelling, abdominal pain, and any other symptoms. Denies allergies to medications.    The history is provided by the patient. No language interpreter was used.    Past Medical History:  Diagnosis Date  . COPD GOLD II with mild reversiblity  09/17/2015   PFT's  09/16/2015   FEV1 1.77 (52 % ) ratio 51  p 15 % improvement from saba with DLCO  54 % corrects to 72 % for alv volume on no rx previsit   . Hyperlipidemia 08/01/2015   Referred to primary care    . PTSD (post-traumatic stress disorder) 12/16/2015  . Tuberculosis 1996   Completed treatment in [facility], [place]    Patient Active Problem List   Diagnosis Date Noted  . Healthcare maintenance 09/20/2016  . Dyspepsia 02/19/2016  . Anxiety disorder 12/16/2015  . COPD GOLD II with mild reversiblity  09/17/2015  . Cigarette smoker 08/02/2015  . Hyperlipidemia 08/01/2015    History reviewed. No pertinent surgical history.     Home Medications    Prior to Admission medications   Medication  Sig Start Date End Date Taking? Authorizing Provider  albuterol (PROVENTIL HFA;VENTOLIN HFA) 108 (90 Base) MCG/ACT inhaler Inhale 2 puffs into the lungs every 4 (four) hours as needed for wheezing or shortness of breath. 02/19/16   Tasrif Ahmed, MD  busPIRone (BUSPAR) 10 MG tablet Take 1 tablet (10 mg total) by mouth 2 (two) times daily. 12/13/16   Tyson Alias, MD  pantoprazole (PROTONIX) 40 MG tablet Take 1 tablet (40 mg total) by mouth daily. 12/13/16   Tyson Alias, MD    Family History Family History  Problem Relation Age of Onset  . Healthy Mother   . Healthy Father     Social History Social History  Substance Use Topics  . Smoking status: Current Every Day Smoker    Packs/day: 0.50    Years: 50.00    Types: Cigarettes    Start date: 08/23/1964    Last attempt to quit: 10/07/2015  . Smokeless tobacco: Never Used     Comment: Cutting back  . Alcohol use 3.6 oz/week    6 Cans of beer per week     Comment: occasionally     Allergies   Patient has no known allergies.   Review of Systems Review of Systems  All other systems reviewed and are negative.    Physical Exam Updated Vital Signs BP 118/78   Pulse 91   Temp 97.9 F (36.6 C) (Oral)   Resp 14   SpO2 97%  Physical Exam  Constitutional: He is oriented to person, place, and time. He appears well-developed and well-nourished. No distress.  HENT:  Head: Normocephalic and atraumatic.  Eyes: EOM are normal.  Neck: Neck supple.  Cardiovascular: Normal rate, regular rhythm and normal heart sounds.  Exam reveals no gallop and no friction rub.   No murmur heard. Pulmonary/Chest: Effort normal and breath sounds normal. No respiratory distress. He has no wheezes. He has no rales.  Abdominal: Soft. He exhibits no distension. There is no tenderness. There is no CVA tenderness.  Musculoskeletal: Normal range of motion.  Neurological: He is alert and oriented to person, place, and time.  Skin: Skin is  warm and dry.  Psychiatric: He has a normal mood and affect. His behavior is normal.  Nursing note and vitals reviewed.    ED Treatments / Results  DIAGNOSTIC STUDIES: Oxygen Saturation is 97% on RA, nl by my interpretation.    COORDINATION OF CARE: 12:09 PM Discussed treatment plan with pt at bedside which includes UA, flagyl, rocephin, zithromax, and pt agreed to plan.   Labs (all labs ordered are listed, but only abnormal results are displayed) Labs Reviewed  URINALYSIS, ROUTINE W REFLEX MICROSCOPIC - Abnormal; Notable for the following:       Result Value   Leukocytes, UA TRACE (*)    All other components within normal limits  RPR  HIV ANTIBODY (ROUTINE TESTING)     Procedures Procedures (including critical care time)  Medications Ordered in ED Medications  cefTRIAXone (ROCEPHIN) injection 250 mg (not administered)  azithromycin (ZITHROMAX) tablet 1,000 mg (not administered)  metroNIDAZOLE (FLAGYL) tablet 2,000 mg (not administered)  ondansetron (ZOFRAN-ODT) disintegrating tablet 4 mg (not administered)  lidocaine (PF) (XYLOCAINE) 1 % injection (not administered)     Initial Impression / Assessment and Plan / ED Course  I have reviewed the triage vital signs and the nursing notes.  Pertinent labs that were available during my care of the patient were reviewed by me and considered in my medical decision making (see chart for details).     Patient presenting today with dysuria, sexual contact who was recently diagnosed with trichomonas and high risk sexual behaviors. He otherwise is well-appearing with normal vital signs. Patient's UA did have trichomonas present. Otherwise unremarkable. Patient was treated with Rocephin, azithromycin, Flagyl. He also had HIV and  Syphilis testing  Final Clinical Impressions(s) / ED Diagnoses   Final diagnoses:  Trichomonas infection    New Prescriptions New Prescriptions   No medications on file   I personally performed  the services described in this documentation, which was scribed in my presence.  The recorded information has been reviewed and considered.     Gwyneth Sprout, MD 12/13/16 1302

## 2016-12-13 NOTE — Progress Notes (Signed)
Assessment and Plan:  See Encounters tab for problem-based medical decision making.   __________________________________________________________  HPI:  65 year old man here for follow-up of anxiety. Patient reports doing fairly well. He unfortunately has been out of his two medications for several weeks. I'm not sure why he has not called in for a refill. He reports that as a result he has been feeling more anxious than usual. He reports having a lot of stresses in his life. We talked about his recent psychiatric evaluation with Dr. Rene Kocher. The patient states he served in the [employer], served overseas during the [place] War, and tells me that he had a discharge that was other than honorable (though also not dishonorable). As a result he does not have any VA benefits. He has been working with an advocate for veterans in his type of situation. She has been helping him to reapply for benefits. He believes that part of this is to demonstrate that he has significant PTSD as a result of his service. In order to better evaluate that we referred him to be evaluated by psychiatry. I reviewed their documentation, which says that he has mild anxiety which seems to be well controlled on his current medication, however Dr. Rene Kocher does not believe he meets criteria for PTSD. The patient tells me that this is a very frustrating situation for him. He feels like he is carrying mental wounds from his time in [place], he briefly opened up about his memories of friends who died during the war. But he then shut down and said that "its a lot of stuff" and that "he doesn't like to talk about it." No SI currently. He is open to restarting buspar as he found it helpful.   Second complaint is of worsening dyspepsia. He says that he stopped taking pantoprazole a few weeks ago. As a result he is starting to have more upper epigastric bloating and nausea. Also with some reflux-type burning pain in his epigastrium. Denies any vomiting.  No melena or hematochezia. No abdominal pain. Appetite is stable. Weight is down 5 pounds from last visit. He is amenable to going back on pantoprazole.  Social life is doing fairly well. He travels a lot for work. His girlfriend lives in [place], she was present at our last visit. He completed a Cologuard which was negative, says that the process was very smooth.  __________________________________________________________  Problem List: Patient Active Problem List   Diagnosis Date Noted  . Dyspepsia 02/19/2016    Priority: High  . Anxiety disorder 12/16/2015    Priority: High  . COPD GOLD II with mild reversiblity  09/17/2015    Priority: Medium  . Cigarette smoker 08/02/2015    Priority: Medium  . Healthcare maintenance 09/20/2016    Priority: Low  . Hyperlipidemia 08/01/2015    Priority: Low    Medications: Reconciled today in Epic __________________________________________________________  Physical Exam:  Vital Signs: Vitals:   12/13/16 0841  BP: (!) 129/91  Pulse: 66  Temp: 97.7 F (36.5 C)  TempSrc: Oral  SpO2: 100%  Weight: 150 lb 4.8 oz (68.2 kg)  Height:  (1.88 m)    Gen: Well appearing, NAD ENT: OP clear without erythema or exudate, moderately poor dentition.  CV: RRR, no murmurs Pulm: Normal effort, CTA throughout, no wheezing Ext: Warm, no edema, normal joints Skin: No atypical appearing moles. No rashes

## 2016-12-13 NOTE — Patient Instructions (Signed)
1. I am going to refer you to another psychiatrist for a second opinion about your service, anxiety, and possible PTSD.
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
## 2016-12-13 NOTE — Assessment & Plan Note (Signed)
Dyspepsia mixed with reflux type symptoms has been intermittent problem. This is very well controlled when he is on a PPI, he discontinued a few weeks ago and says he's had return of mild-moderate symptoms. Weight is stable. No other red flag symptoms for now. We'll reinitiate pantoprazole 40 mg once daily and see if that controls his symptoms. If the symptoms worsen in the future, or we can control it with the PPI we will refer for an EGD.

## 2016-12-13 NOTE — Assessment & Plan Note (Signed)
Patient with mild COPD that is symptomatically doing well. No significant dyspnea with exertion. Uses albuterol about twice weekly. Has about 2 upper respiratory tract infections annually. We talked about the risks and benefits of adding a antimuscarinic inhaler. The patient is minimalist with his medications and will prefer to hold on adding new medicines unless it was absolute necessary. With his overall good control I think it is fine to continue with only as needed albuterol for now. Doing fairly well with tobacco cessation which I reinforced today.

## 2016-12-13 NOTE — Assessment & Plan Note (Signed)
Anxiety state is symptomatically well controlled with buspirone 10 mg twice a day. I reordered this and we will continue. Patient had a consult with psychiatry to evaluate for possible PTSD related to his service in the [place] War. The patient feels like many of his anxiety and stress-related symptoms are related to his wartime experiences, and feels that he should receive more support from the [employer] as a result. Unfortunately his discharge was other than honorable, so currently he is without any VA benefits. I reviewed the note from Dr. Rene Kocher with behavioral health, who feels the patient has mild anxiety state, currently well controlled on buspirone, and unlikely to have PTSD. The patient disagrees with this assessment, he feels like much of his anxiety is directly linked to his service. He has difficulty talking about his feelings and his memories from the war. I told him that I cannot overturn this assessment to make the diagnosis of PTSD myself. Instead we decided to refer for a second opinion from another psychiatrist. I encouraged him to be more reflective on his emotions, and how his wartime experience still affects him. I think it's possible that with his difficulty communicating his feelings a psychiatrist could see him as trying to obtain the diagnosis for the benefits alone. The patient was agreeable to a second opinion.

## 2016-12-14 LAB — RPR: RPR: NONREACTIVE

## 2016-12-14 LAB — HIV ANTIBODY (ROUTINE TESTING W REFLEX): HIV SCREEN 4TH GENERATION: NONREACTIVE

## 2017-04-20 ENCOUNTER — Other Ambulatory Visit: Payer: Self-pay | Admitting: *Deleted

## 2017-05-23 IMAGING — CT CT CHEST W/ CM
2 of 3 series · 15 of 36 positions shown, 18 images · IV contrast (Omni 300)
Comparison: Chest radiograph dated 06/18/2015

CLINICAL DATA: Patient has history of TB. Patient complains of
coughing yellow sputum for the last 4 days.

EXAM:
CT CHEST WITH CONTRAST
TECHNIQUE: Multidetector CT imaging of the chest was performed during
intravenous contrast administration.
CONTRAST:  75mL OMNIPAQUE IOHEXOL 300 MG/ML  SOLN

[Series 2: chest with 5mm st · axial · 0.67mm/px · z∈[+1028,+1344]mm · 12 of 75 slices shown, 15 images]
[im 6/75  mediastinal]
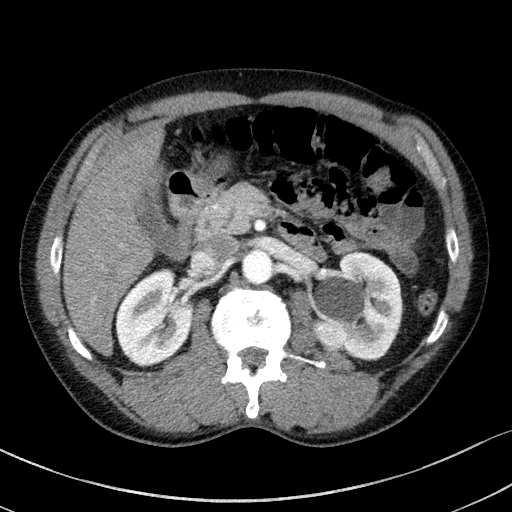
[im 6/75  lung]
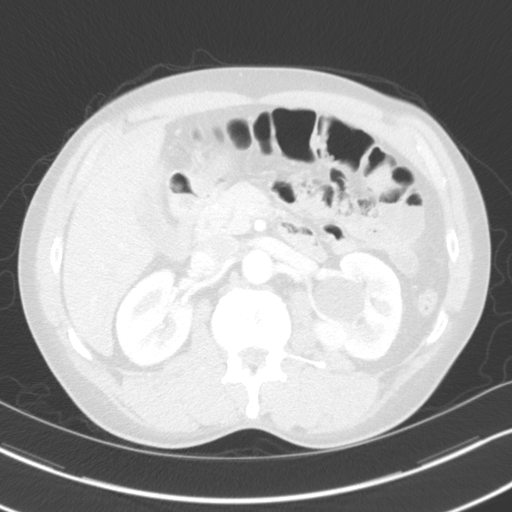
[im 11/75  lung]
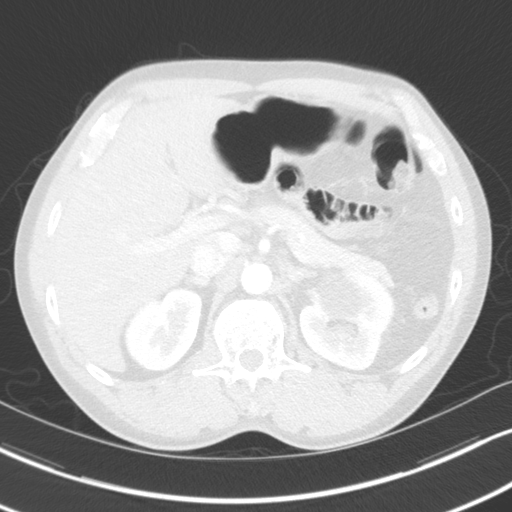
[im 17/75  lung]
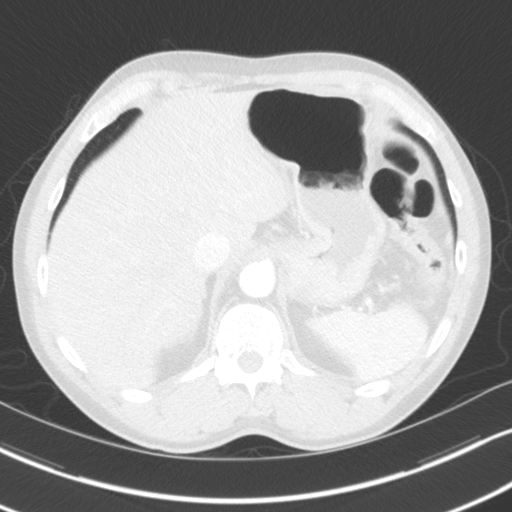
[im 22/75  lung]
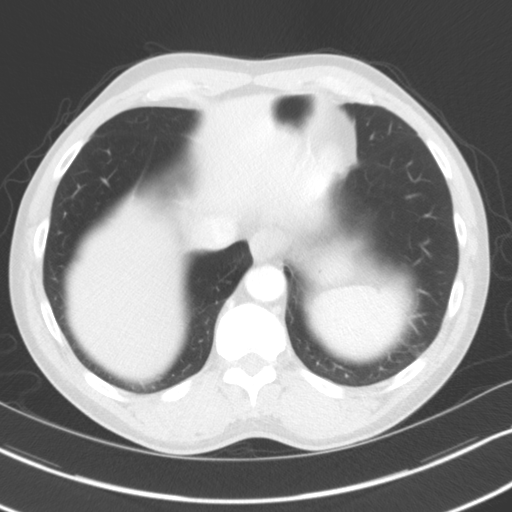
[im 28/75  mediastinal]
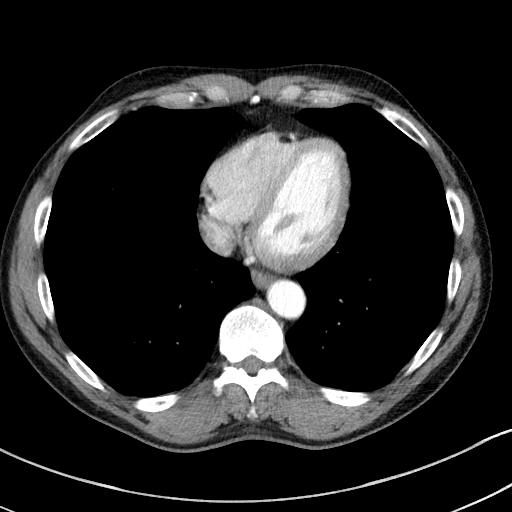
[im 28/75  lung]
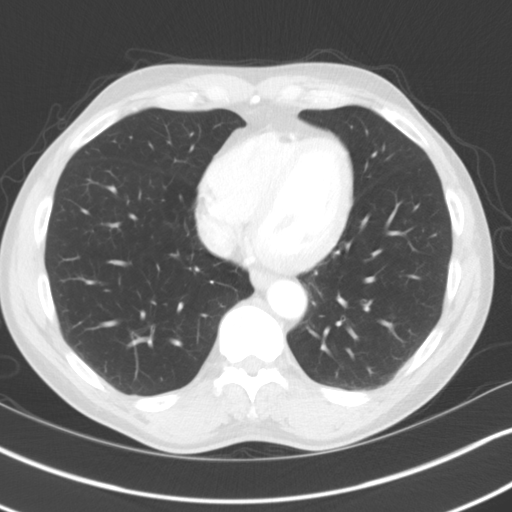
[im 33/75  lung]
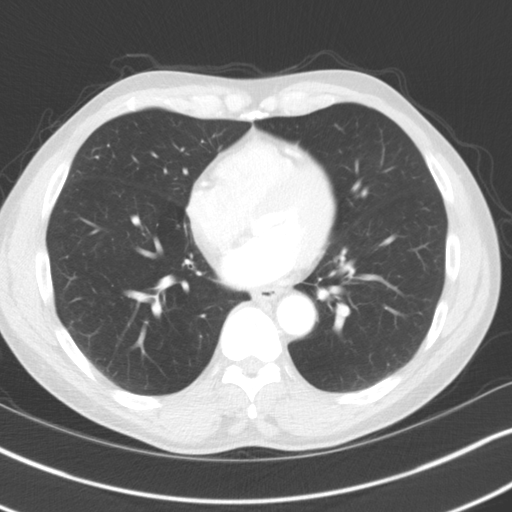
[im 42/75  lung]
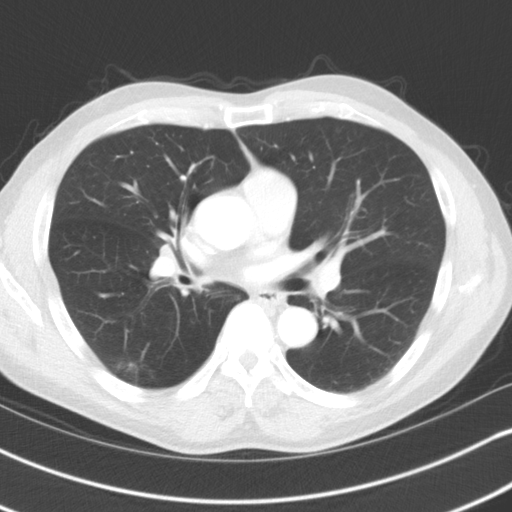
[im 47/75  lung]
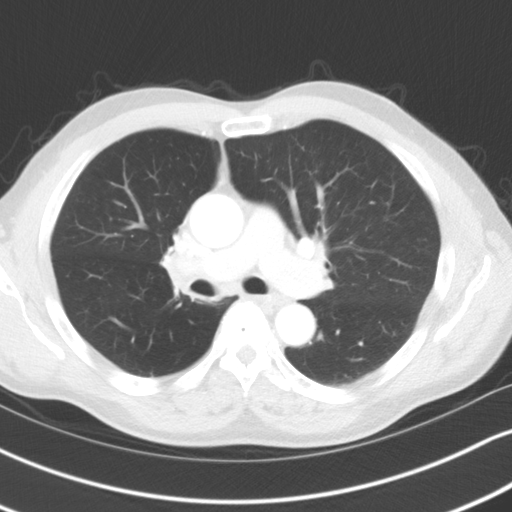
[im 53/75  mediastinal]
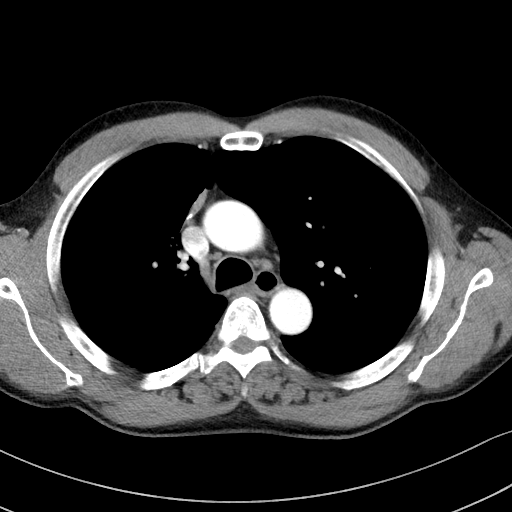
[im 53/75  lung]
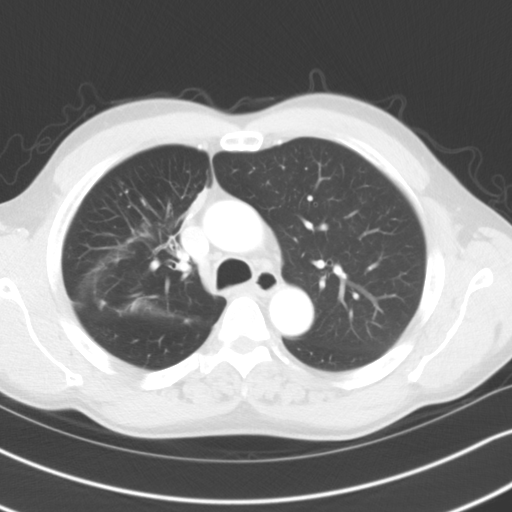
[im 58/75  lung]
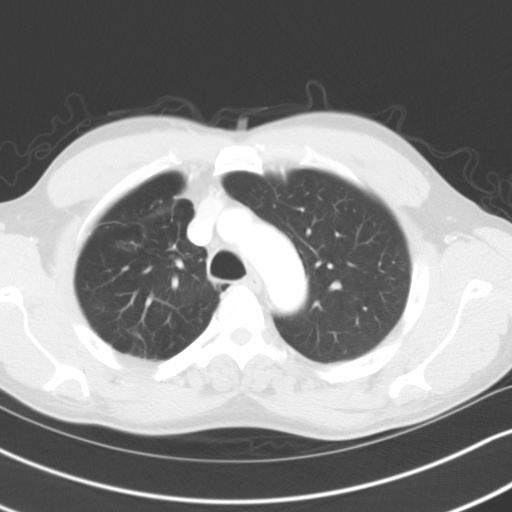
[im 64/75  lung]
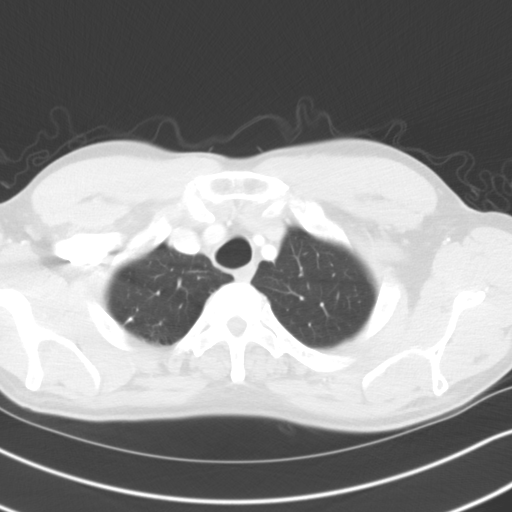
[im 69/75  lung]
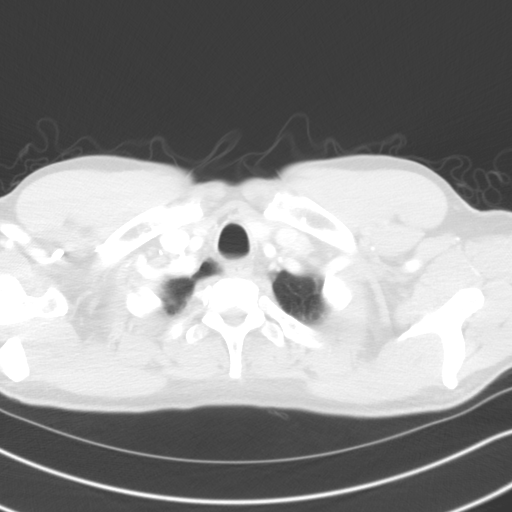

[Series 4: chest with 3mm st cor · coronal · 0.73mm/px · 3 of 88 slices shown]
[im 18/88  lung]
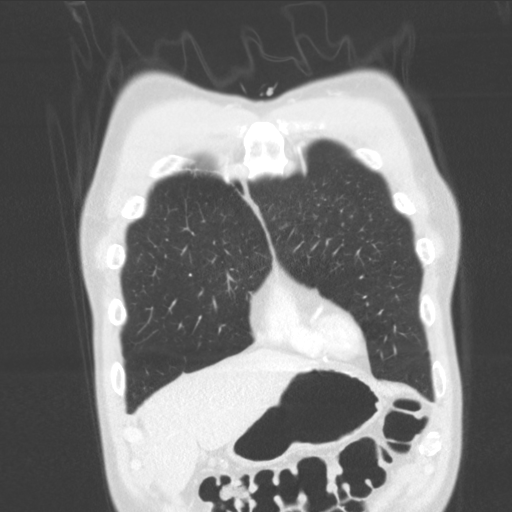
[im 35/88  lung]
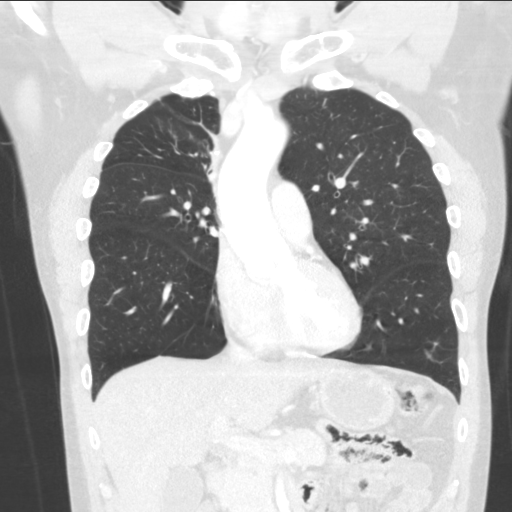
[im 53/88  lung]
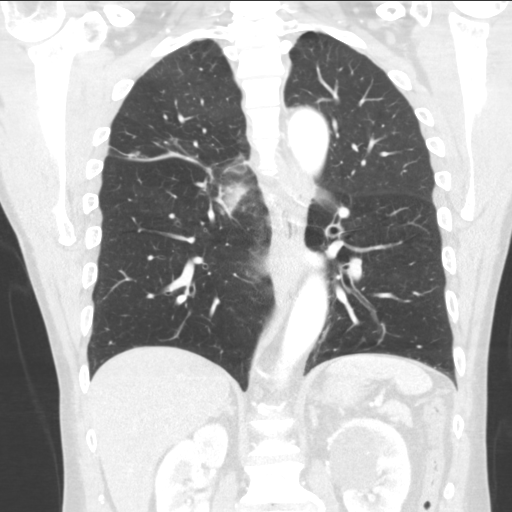

[15 of 36 positions shown; findings below may reference images not displayed]

FINDINGS: No pericardial effusion. The heart is normal in size. Thoracic aorta
is normal in caliber. Visualized thyroid is unremarkable.

No pathologically enlarged hilar, mediastinal, or axillary lymph
nodes are seen.

Airways are patent. The lungs are hyperinflated. There is a sub
solid spiculated opacity which measures 12 mm in the superior
segment of right lower lobe subpleurally. Similar linear
ground-glass areas of opacity are seen in the posterior perifissural
portion of the right upper lobe. There is atelectasis and volume
loss in the right upper lobe.

There is a 4.2 cm left parapelvic renal cyst. Visualized upper
abdomen is otherwise unremarkable. No evidence of acute osseous
abnormality. Osteoarthritic changes are seen in the lower thoracic
and upper lumbar sacral spine.

Evaluation of the osseous structures demonstrates disc space
narrowing and associated endplate sclerotic and erosive changes at
C7-T1, T11-T12, L1-L2 and L2-L3. The rest of the vertebral bodies
and disc spaces are relatively preserved.
IMPRESSION: Hyperinflation of the lungs, consistent with chronic obstructive
pulmonary disease.

Volume loss and atelectatic changes in the right upper lobe, with
perifissural areas of streaky opacities. Subpleural area of
ground-glass opacity in the superior segment of right lower lobe.
These findings may represent postinfectious scarring.
Bronchoalveolar carcinoma, although possible, is felt less likely.
There is no evidence of focal airspace consolidation, or cavitary
lesions.

Follow-up chest CT at 6-12 months is recommended.

Disc centered sclerotic and erosive changes at C7-T1, T11-T12, L1-L2
and L2-L3. These may represent osteoarthritic changes, however in
the background of relatively preserved dick spaces in the remaining
thoracic spine, discitis is also of consideration.

Left parapelvic renal cyst.

## 2017-11-04 ENCOUNTER — Other Ambulatory Visit: Payer: Self-pay | Admitting: Student in an Organized Health Care Education/Training Program

## 2018-02-02 ENCOUNTER — Other Ambulatory Visit: Payer: Self-pay | Admitting: Student in an Organized Health Care Education/Training Program

## 2018-02-09 ENCOUNTER — Other Ambulatory Visit: Payer: Self-pay | Admitting: *Deleted

## 2018-03-20 ENCOUNTER — Ambulatory Visit (INDEPENDENT_AMBULATORY_CARE_PROVIDER_SITE_OTHER): Payer: Medicare HMO | Admitting: Student in an Organized Health Care Education/Training Program

## 2018-03-20 ENCOUNTER — Encounter: Payer: Self-pay | Admitting: Student in an Organized Health Care Education/Training Program

## 2018-03-20 VITALS — BP 121/82 | HR 104 | Temp 99.0°F | Wt 150.6 lb

## 2018-03-20 DIAGNOSIS — L84 Corns and callosities: Secondary | ICD-10-CM

## 2018-03-20 DIAGNOSIS — Z79899 Other long term (current) drug therapy: Secondary | ICD-10-CM

## 2018-03-20 DIAGNOSIS — J449 Chronic obstructive pulmonary disease, unspecified: Secondary | ICD-10-CM | POA: Diagnosis not present

## 2018-03-20 DIAGNOSIS — K029 Dental caries, unspecified: Secondary | ICD-10-CM

## 2018-03-20 DIAGNOSIS — F419 Anxiety disorder, unspecified: Secondary | ICD-10-CM

## 2018-03-20 DIAGNOSIS — D751 Secondary polycythemia: Secondary | ICD-10-CM | POA: Diagnosis not present

## 2018-03-20 DIAGNOSIS — E785 Hyperlipidemia, unspecified: Secondary | ICD-10-CM

## 2018-03-20 DIAGNOSIS — Z23 Encounter for immunization: Secondary | ICD-10-CM | POA: Diagnosis not present

## 2018-03-20 DIAGNOSIS — F17210 Nicotine dependence, cigarettes, uncomplicated: Secondary | ICD-10-CM | POA: Diagnosis not present

## 2018-03-20 MED ORDER — SPIRIVA HANDIHALER 18 MCG IN CAPS: 18.0000 ug | ORAL_CAPSULE | Freq: Every day | RESPIRATORY_TRACT | 3 refills | Status: DC

## 2018-03-20 MED ORDER — ALBUTEROL SULFATE HFA 108 (90 BASE) MCG/ACT IN AERS: 2.0000 | INHALATION_SPRAY | RESPIRATORY_TRACT | 0 refills | Status: DC | PRN

## 2018-03-20 NOTE — Assessment & Plan Note (Signed)
Left foot has a painful and tender callus at the calcaneus, probably from all the walking he does, some poor shoes.  Good blood flow, no neuropathy.  Plan to refer him to podiatry for removal.

## 2018-03-20 NOTE — Progress Notes (Signed)
   Assessment and Plan:  See Encounters tab for problem-based medical decision making.   __________________________________________________________  HPI:   67 year old man here for follow-up of COPD.  Patient reports doing very well since I last saw him.  Breathing capacity is stable, denies any worsening dyspnea with exertion.  No recent upper respiratory infections or exacerbations.  Continues to smoke cigarettes, but working on transitioning to a vaporizer.  Reports that his anxiety is much improved since I last saw him.  He says he is in a good place.  He briefly moved to [place] for a few months, and now was moved back to [place].  Using buspirone, but says that he wants to try to discontinue it.    He has a complaint of a painful callus on his left heel.  He has had 1 of these in the past which improved with it being shaved off by podiatry.  He is on his feet all day for his job and walks a lot.  Denies any numbness or tingling in his feet.  __________________________________________________________  Problem List: Patient Active Problem List   Diagnosis Date Noted  . Dyspepsia 02/19/2016    Priority: High  . Anxiety disorder 12/16/2015    Priority: High  . COPD GOLD II with mild reversiblity  09/17/2015    Priority: Medium  . Cigarette smoker 08/02/2015    Priority: Medium  . Healthcare maintenance 09/20/2016    Priority: Low  . Hyperlipidemia 08/01/2015    Priority: Low  . Callus of foot 02/19/2016  . Polycythemia 08/02/2015    Medications: Reconciled today in Epic __________________________________________________________  Physical Exam:  Vital Signs: Vitals:   03/20/18 1036  BP: 121/82  Pulse: (!) 104  Temp: 99 F (37.2 C)  TempSrc: Oral  SpO2: 99%  Weight: 150 lb 9.6 oz (68.3 kg)    Gen: Well appearing, NAD ENT: Multiple caries on his teeth, right back mandibular molar is fractured but with no bleeding Neck: No cervical LAD, No thyromegaly or  nodules, No JVD. CV: RRR, no murmurs Pulm: Normal effort, CTA throughout, no wheezing.  Ext: Warm, no edema, normal joints

## 2018-03-20 NOTE — Assessment & Plan Note (Signed)
Likely due to tobacco use follow this.  Plan to check a CBC and CMP today.

## 2018-03-20 NOTE — Assessment & Plan Note (Signed)
Symptomatically stable, still using tobacco products.  I refilled Spiriva to be used once daily as well as albuterol for breakthrough.  Symptomatically well controlled.  No recent exacerbations.  I spent 4 minutes discussing the importance of tobacco cessation, he is got a plan to try to transition to date devices which is probably going to be less risky for him than cigarettes.

## 2018-03-20 NOTE — Patient Instructions (Addendum)
I refilled your inhaler medications, remember to use the Spiriva only once daily.  Use the albuterol only as needed.  I referred you to podiatry for the callus on your foot to be removed.    We gave you a list of dentists that take Medicaid, please call and make an appointment with their office.

## 2018-03-20 NOTE — Assessment & Plan Note (Signed)
GAD7 Score of 2 today.  He looks like he is doing much better than on last visit. Plan to discontinue buspirone. Follow up as needed.

## 2018-03-20 NOTE — Addendum Note (Signed)
Addended by: Maura CrandallGOLDSTON, Rylea Selway C on: 03/20/2018 11:49 AM   Modules accepted: Orders

## 2018-03-21 ENCOUNTER — Telehealth: Payer: Self-pay | Admitting: Student in an Organized Health Care Education/Training Program

## 2018-03-21 DIAGNOSIS — B1920 Unspecified viral hepatitis C without hepatic coma: Secondary | ICD-10-CM | POA: Insufficient documentation

## 2018-03-21 DIAGNOSIS — R768 Other specified abnormal immunological findings in serum: Secondary | ICD-10-CM

## 2018-03-21 LAB — CBC
HEMATOCRIT: 47.5 % (ref 37.5–51.0)
HEMOGLOBIN: 16.5 g/dL (ref 13.0–17.7)
MCH: 32.3 pg (ref 26.6–33.0)
MCHC: 34.7 g/dL (ref 31.5–35.7)
MCV: 93 fL (ref 79–97)
Platelets: 175 10*3/uL (ref 150–450)
RBC: 5.11 x10E6/uL (ref 4.14–5.80)
RDW: 13.4 % (ref 12.3–15.4)
WBC: 7.2 10*3/uL (ref 3.4–10.8)

## 2018-03-21 LAB — CMP14 + ANION GAP
ALK PHOS: 69 IU/L (ref 39–117)
ALT: 21 IU/L (ref 0–44)
AST: 30 IU/L (ref 0–40)
Albumin/Globulin Ratio: 1.4 (ref 1.2–2.2)
Albumin: 4.2 g/dL (ref 3.6–4.8)
Anion Gap: 15 mmol/L (ref 10.0–18.0)
BILIRUBIN TOTAL: 0.4 mg/dL (ref 0.0–1.2)
BUN / CREAT RATIO: 14 (ref 10–24)
BUN: 15 mg/dL (ref 8–27)
CHLORIDE: 100 mmol/L (ref 96–106)
CO2: 25 mmol/L (ref 20–29)
Calcium: 9.5 mg/dL (ref 8.6–10.2)
Creatinine, Ser: 1.05 mg/dL (ref 0.76–1.27)
GFR calc Af Amer: 84 mL/min/{1.73_m2} (ref >59)
GFR calc non Af Amer: 73 mL/min/{1.73_m2} (ref >59)
GLUCOSE: 98 mg/dL (ref 65–99)
Globulin, Total: 3 g/dL (ref 1.5–4.5)
POTASSIUM: 4.6 mmol/L (ref 3.5–5.2)
Sodium: 140 mmol/L (ref 134–144)
Total Protein: 7.2 g/dL (ref 6.0–8.5)

## 2018-03-21 LAB — HEPATITIS C ANTIBODY

## 2018-03-21 NOTE — Telephone Encounter (Signed)
Pt is calling back to speak with a nurse. Please call back.  

## 2018-03-21 NOTE — Telephone Encounter (Signed)
Screening HCV antibody test returned positive. I have placed future orders to check for viral load, genotype, and other hepatitis virus serologies. Liver function is normal, platelets normal.  Purnell ShoemakerKaye, can you call Casey Ryan and have him come in for those lab draws. We won't know if he truly has hepatitis C until we see those lab results. If he does have HCV, we will be able to treat it.

## 2018-03-21 NOTE — Telephone Encounter (Signed)
Call made to patient, no anwer-HIPPA compliant message left on recorder.Casey AlvineGoldston, Casey Barcelo Cassady7/30/20192:04 PM

## 2018-03-22 ENCOUNTER — Telehealth: Payer: Self-pay | Admitting: Student in an Organized Health Care Education/Training Program

## 2018-03-22 NOTE — Telephone Encounter (Signed)
Casey Ryan  Pt returning call regarding lab results, pt contact # [phone]

## 2018-03-22 NOTE — Telephone Encounter (Signed)
This is being addressed in original encounter from 03/21/2018. Kinnie FeilL. Ducatte, RN, BSN

## 2018-03-22 NOTE — Telephone Encounter (Signed)
Spoke with patient regarding lab results.  No further action needed, phone call complete.[name], [name]7/31/201910:25 AM

## 2018-03-22 NOTE — Telephone Encounter (Signed)
Left message on VM requesting return call. L. Ducatte, RN, BSN   

## 2018-03-22 NOTE — Telephone Encounter (Signed)
Received return call from patient-pt informed on lab results and need to return for additional labs.  Pt states he is unable to come this week, but will return next Thursday 8/8 for lab only.  Will add pt to lab schedule and inform pcp.[name], Alea Ryer Cassady7/31/201910:28 AM

## 2018-03-24 ENCOUNTER — Telehealth: Payer: Self-pay | Admitting: *Deleted

## 2018-03-24 NOTE — Telephone Encounter (Signed)
Call from patient stating I gave him a list of dentists that accept medicaid.  He called one on list located on [address], but has since lost the paper and no longer has the address and phone number.  Obtained contact info for patient and left on his recorder as requested for Dr Fayrene FearingJames McMasters/Dr Stephenie AcresEric Sadler located on [address] [place] [id number] [phone].Kingsley SpittleGoldston, Azuree Minish Cassady8/2/201912:04 PM

## 2018-03-29 ENCOUNTER — Other Ambulatory Visit (INDEPENDENT_AMBULATORY_CARE_PROVIDER_SITE_OTHER): Payer: 59

## 2018-03-29 DIAGNOSIS — R768 Other specified abnormal immunological findings in serum: Secondary | ICD-10-CM

## 2018-03-30 LAB — HEPATITIS B SURFACE ANTIBODY,QUALITATIVE: Hep B Surface Ab, Qual: REACTIVE

## 2018-03-30 LAB — HIV ANTIBODY (ROUTINE TESTING W REFLEX): HIV SCREEN 4TH GENERATION: NONREACTIVE

## 2018-03-30 LAB — HEPATITIS A ANTIBODY, TOTAL: Hep A Total Ab: POSITIVE — AB

## 2018-03-30 LAB — HEPATITIS B CORE ANTIBODY, TOTAL: Hep B Core Total Ab: POSITIVE — AB

## 2018-03-30 LAB — HEPATITIS B SURFACE ANTIGEN: HEP B S AG: NEGATIVE

## 2018-04-02 LAB — HCV RNA QUANT
HCV log10: 6.161 log10 IU/mL
Hepatitis C Quantitation: 1450000 IU/mL

## 2018-04-02 LAB — HEPATITIS C GENOTYPE

## 2018-04-07 ENCOUNTER — Ambulatory Visit (INDEPENDENT_AMBULATORY_CARE_PROVIDER_SITE_OTHER): Payer: 59 | Admitting: Podiatry

## 2018-04-07 ENCOUNTER — Encounter: Payer: Self-pay | Admitting: Podiatry

## 2018-04-07 DIAGNOSIS — Q828 Other specified congenital malformations of skin: Secondary | ICD-10-CM | POA: Diagnosis not present

## 2018-04-07 DIAGNOSIS — M722 Plantar fascial fibromatosis: Secondary | ICD-10-CM | POA: Diagnosis not present

## 2018-04-07 MED ORDER — TRIAMCINOLONE ACETONIDE 10 MG/ML IJ SUSP
10.0000 mg | Freq: Once | INTRAMUSCULAR | Status: AC
  Administered 2018-04-07: 10 mg

## 2018-04-08 NOTE — Progress Notes (Signed)
Subjective:   Patient ID: Casey Ryan, Casey Ryan   DOB: 67 y.o.   MRN: [id number]   HPI Patient presents with painful callus on the bottom of the left foot that is been present for a period of time and is painful.  Also complaining of inflammation of the plantar heel right  ROS      Objective:  Physical Exam  Neurovascular status intact with keratotic lesion that is painful.  I noted there to be pain in the plantar heel right     Assessment:  Chronic lesion formation with structural changes with fascial pain right     Plan:  Debridement of lesions with no iatrogenic bleeding noted and reappoint for routine care also went ahead today and injected the plantar fascial right which has been bothersome

## 2018-04-10 ENCOUNTER — Telehealth: Payer: Self-pay | Admitting: Student in an Organized Health Care Education/Training Program

## 2018-04-10 NOTE — Telephone Encounter (Signed)
Pt wants to know why he needs to give more blood, pls call patient [phone]

## 2018-04-10 NOTE — Telephone Encounter (Signed)
Pt missed call, pt contact# [phone]

## 2018-04-11 NOTE — Telephone Encounter (Signed)
I have tried to call twice, yesterday and today, with no response. A visit might be better.

## 2018-04-12 NOTE — Telephone Encounter (Signed)
Called patient, states he already has appt to see PCP on 04/25/2018. Appt that day is for lab only. First available with PCP is 05/29/2018. [facility] per patient request. Casey FeilL. Addis Tuohy, RN, BSN

## 2018-04-25 ENCOUNTER — Other Ambulatory Visit: Payer: Self-pay

## 2018-04-25 ENCOUNTER — Ambulatory Visit (INDEPENDENT_AMBULATORY_CARE_PROVIDER_SITE_OTHER): Payer: 59 | Admitting: Student in an Organized Health Care Education/Training Program

## 2018-04-25 VITALS — BP 128/87 | HR 82 | Temp 98.2°F | Ht 74.0 in | Wt 148.1 lb

## 2018-04-25 DIAGNOSIS — Z23 Encounter for immunization: Secondary | ICD-10-CM

## 2018-04-25 DIAGNOSIS — B182 Chronic viral hepatitis C: Secondary | ICD-10-CM | POA: Diagnosis not present

## 2018-04-25 NOTE — Assessment & Plan Note (Signed)
Hepatitis C infection, genotype 1b with positive viral load.  He has immunity to hepatitis B through exposure, also immune to hepatitis A.  No signs of cirrhosis on exam, liver function otherwise normal on labs.  Will check a serum fibro-sure score today.  I think the patient is a good candidate for treatment with Mavyret. Will wait on fibrosure result, then submit application to Medicaid.
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
## 2018-04-27 ENCOUNTER — Telehealth: Payer: Self-pay | Admitting: Student in an Organized Health Care Education/Training Program

## 2018-04-27 DIAGNOSIS — B182 Chronic viral hepatitis C: Secondary | ICD-10-CM

## 2018-04-27 LAB — HCV FIBROSURE
ALPHA 2-MACROGLOBULINS, QN: 259 mg/dL (ref 110–276)
ALT (SGPT) P5P: 31 IU/L (ref 0–55)
APOLIPOPROTEIN A I: 194 mg/dL — AB (ref 101–178)
BILIRUBIN, TOTAL: 0.4 mg/dL (ref 0.0–1.2)
Fibrosis Score: 0.35 — ABNORMAL HIGH (ref 0.00–0.21)
GGT: 39 IU/L (ref 0–65)
Haptoglobin: 87 mg/dL (ref 34–200)
Necroinflammat Activity Score: 0.16 (ref 0.00–0.17)

## 2018-04-27 MED ORDER — GLECAPREVIR-PIBRENTASVIR 100-40 MG PO TABS: 3.0000 | ORAL_TABLET | Freq: Every day | ORAL | 1 refills | Status: DC

## 2018-04-27 NOTE — Telephone Encounter (Signed)
Patient with chronic hepatitis C infection without cirrhosis. We have completed all the necessary blood work. He is not HBV co-infected. Fibrosis score of F1-F2. Good renal function.   I would like to start treatment. He is DAA treatment naive. He has Medicare coverage. I have prescribed Mavyret 3 tabs once daily for 8 weeks. This will have to go through a prior authorization in the coming days.

## 2018-05-01 ENCOUNTER — Other Ambulatory Visit: Payer: Self-pay | Admitting: Student in an Organized Health Care Education/Training Program

## 2018-05-01 DIAGNOSIS — J449 Chronic obstructive pulmonary disease, unspecified: Secondary | ICD-10-CM

## 2018-05-02 ENCOUNTER — Other Ambulatory Visit: Payer: Self-pay | Admitting: *Deleted

## 2018-05-03 MED ORDER — PANTOPRAZOLE SODIUM 40 MG PO TBEC: 40.0000 mg | DELAYED_RELEASE_TABLET | Freq: Every day | ORAL | 0 refills | Status: DC

## 2018-05-05 ENCOUNTER — Telehealth: Payer: Self-pay | Admitting: *Deleted

## 2018-05-05 NOTE — Telephone Encounter (Signed)
Reviewed & I agree w your A&E DrG

## 2018-05-05 NOTE — Telephone Encounter (Signed)
Patient called in requesting penicillin for chest congestion. States it has been going on for 2 weeks with associated nausea. Also c/o slight cough, occasionally productive of white sputum. Denies fever. [facility]. Patient states he has to work today and can't come to clinic. Appt given for Mon 05/08/2018 at 0945. Discussed trying OTC guaifenesin and drinking plenty of water. Made aware to go to UC or ED if chest congestion worsens or fever develops over weekend. Patient states understanding. Kinnie FeilL. Moody Robben, RN, BSN

## 2018-05-08 ENCOUNTER — Ambulatory Visit (INDEPENDENT_AMBULATORY_CARE_PROVIDER_SITE_OTHER): Payer: 59 | Admitting: Internal Medicine

## 2018-05-08 VITALS — BP 111/73 | HR 81 | Temp 98.6°F | Wt 149.3 lb

## 2018-05-08 DIAGNOSIS — Z79899 Other long term (current) drug therapy: Secondary | ICD-10-CM | POA: Diagnosis not present

## 2018-05-08 DIAGNOSIS — Z23 Encounter for immunization: Secondary | ICD-10-CM | POA: Diagnosis not present

## 2018-05-08 DIAGNOSIS — J441 Chronic obstructive pulmonary disease with (acute) exacerbation: Secondary | ICD-10-CM | POA: Diagnosis not present

## 2018-05-08 MED ORDER — AZITHROMYCIN 250 MG PO TABS: ORAL_TABLET | ORAL | 0 refills | Status: AC

## 2018-05-08 NOTE — Progress Notes (Signed)
   CC: cough  HPI:  [name] is a 67 y.o. with a PMH of COPD, presenting to clinic for cough.   Patient states ~2243mo h/o mild shortness of breath, increased volume of white sputum and wheezing which has been getting somewhat better in the last couple of days since starting guaifenesin. He states he has spiriva at home but would not specify if he is using it; he has been using albuterol a couple of times in the last week with improvement in shortness of breath. He is able to perform ADLs without issue and is eating ok. He denies fevers, chills, nausea, vomiting.   Please see problem based Assessment and Plan for status of patients chronic conditions.  Past Medical History:  Diagnosis Date  . COPD GOLD II with mild reversiblity  09/17/2015   PFT's  09/16/2015   FEV1 1.77 (52 % ) ratio 51  p 15 % improvement from saba with DLCO  54 % corrects to 72 % for alv volume on no rx previsit   . Hyperlipidemia 08/01/2015   Referred to primary care    . PTSD (post-traumatic stress disorder) 12/16/2015  . Tuberculosis 1996   [place] [facility] [facility] [place]    Review of Systems:   Per HPI  Physical Exam:  Vitals:   05/08/18 1330  BP: 111/73  Pulse: 81  Temp: 98.6 F (37 C)  TempSrc: Oral  SpO2: 99%  Weight: 149 lb 4.8 oz (67.7 kg)   GENERAL- alert, co-operative, appears as stated age, not in any distress. CARDIAC- RRR, no murmurs, rubs or gallops. RESP- Moving equal volumes of air, no rales; very mild, diffuse end-expiratory wheezing ABDOMEN- Soft, nontender, bowel sounds present.  Assessment & Plan:   See Encounters Tab for problem based charting.   Patient discussed with Dr. Cherene AltesNarendra   Brindley Madarang, MD Internal Medicine PGY-3

## 2018-05-08 NOTE — Assessment & Plan Note (Addendum)
Patient with dyspnea and increased volume of sputum production with diffuse mild wheezing on exam consistent with COPD exacerbation. Due to length of symptoms he may have post-viral bronchitis; no focal wheezing or rales on exam to indicate lobar pneumonia.   Plan: --azithromycin 500mg  x1d then 250mg  daily for 4d --advised to use spiriva daily; prn albuterol --advised to RTC in 1 wk if not improving

## 2018-05-08 NOTE — Patient Instructions (Signed)
Please take your Zpack as listed in the instructions. Use Spiriva daily, and use your albuterol as needed for shortness of breath.  If your congestion, wheezing and cough aren't improving in the next week, please come back.

## 2018-05-09 NOTE — Progress Notes (Signed)
Internal Medicine Clinic Attending  Case discussed with Dr. Svalina  at the time of the visit.  We reviewed the resident's history and exam and pertinent patient test results.  I agree with the assessment, diagnosis, and plan of care documented in the resident's note.  

## 2018-05-12 ENCOUNTER — Other Ambulatory Visit: Payer: Self-pay | Admitting: Student in an Organized Health Care Education/Training Program

## 2018-05-12 ENCOUNTER — Telehealth: Payer: Self-pay | Admitting: *Deleted

## 2018-05-12 NOTE — Telephone Encounter (Signed)
[facility]- Mavyet needs a PA. I will ask Alba CoryKaye G to f/u.

## 2018-05-12 NOTE — Telephone Encounter (Signed)
Refill Request  Glecaprevir-Pibrentasvir (MAVYRET) 100-40 MG TABS  [id number] - [place],  - [address]

## 2018-05-12 NOTE — Telephone Encounter (Addendum)
Information was sent through CoverMyMeds for PA for Mavyret.  Awaiting decision from patieits insurance within 72 hours.  Angelina OkGladys Malikye Reppond, RN 05/12/2018 12:03 PM . PA for Mavyret was approved 05/08/2018 thru 08/01/2018.  Angelina OkGladys Tamirra Sienkiewicz, RN 05/15/2018 1:21 PM.

## 2018-05-15 NOTE — Telephone Encounter (Signed)
See Casey FootGladys Ryan telephone encounter on 9/20.

## 2018-05-24 ENCOUNTER — Telehealth: Payer: Self-pay | Admitting: Student in an Organized Health Care Education/Training Program

## 2018-05-24 DIAGNOSIS — B182 Chronic viral hepatitis C: Secondary | ICD-10-CM

## 2018-05-24 NOTE — Telephone Encounter (Signed)
Placed call to patient. No answer. Left message on VM requesting return call. L. Ducatte, RN, BSN     

## 2018-05-24 NOTE — Telephone Encounter (Signed)
This is beeing addressed in original phone encounter from today. Kinnie Feil, RN, BSN

## 2018-05-24 NOTE — Telephone Encounter (Signed)
Patient returned call. States he has not yet picked up medication. Instructed to pick it up as soon as possible. Stressed importance of taking 3 tabs everyday and not to miss any doses. Requested he call to schedule 4 week lab appt as soon as he starts med. Also asked him to call office if experiences any side effects. Patient states agreement with all the above. Kinnie Feil, RN, BSN

## 2018-05-24 NOTE — Telephone Encounter (Signed)
Pt is returning a call, pt contact [phone]

## 2018-05-24 NOTE — Telephone Encounter (Signed)
Patient recently approved to receive Mavyret to treat chronic hepatitis C infection. Can you please call him to see when he started the medicine and see if he is having any side effects? We need to stress compliance, he should take three tablets every day, no missed doses. Also, we should check labs 4 weeks after he started treatment. I have placed future orders so he can make a lab appointment to have those drawn. Thanks.

## 2018-05-24 NOTE — Telephone Encounter (Signed)
Great.  Thanks

## 2018-05-26 NOTE — Telephone Encounter (Signed)
Thanks Candise Bowens! I put in labs for him to be done four weeks after he starts the med. I hope he will come in for them. Thanks for your help.

## 2018-05-26 NOTE — Telephone Encounter (Signed)
Hi Dr. Oswaldo Done, the prior Casey Ryan was successful and patient states he will be picking up the medication today I believe (may have picked it up yesterday). Please let me know if further help needed.  Thank you!

## 2018-05-26 NOTE — Telephone Encounter (Signed)
Ok.  Thanks for the follow up

## 2018-05-26 NOTE — Telephone Encounter (Addendum)
Patient called in stating [facility] [phone]. Placed call to [facility] "[facility]" was told they only received Rx today. Patient should receive med via mail on 05/29/2018. Patient notified. Reiterated again importance of taking 3 tabs daily without missing doses. He will call back after he has received med to schedule 4 week lab visit. Kinnie Feil, RN, BSN

## 2018-05-28 ENCOUNTER — Other Ambulatory Visit: Payer: Self-pay | Admitting: Internal Medicine

## 2018-05-28 DIAGNOSIS — J449 Chronic obstructive pulmonary disease, unspecified: Secondary | ICD-10-CM

## 2018-05-30 ENCOUNTER — Telehealth: Payer: Self-pay | Admitting: *Deleted

## 2018-05-30 NOTE — Telephone Encounter (Signed)
Pt stated he has received his medication for hepatitis, Mavyret. And was told by his doctor to call when he had received it. Needs to schedule  4 week lab appt 4 weeks afterstarting. Lab appt scheduled Nov 7 @ 0900 AM.

## 2018-06-12 ENCOUNTER — Ambulatory Visit: Payer: Self-pay | Admitting: Student in an Organized Health Care Education/Training Program

## 2018-06-24 ENCOUNTER — Other Ambulatory Visit: Payer: Self-pay | Admitting: Student in an Organized Health Care Education/Training Program

## 2018-06-24 DIAGNOSIS — J449 Chronic obstructive pulmonary disease, unspecified: Secondary | ICD-10-CM

## 2018-06-29 ENCOUNTER — Other Ambulatory Visit (INDEPENDENT_AMBULATORY_CARE_PROVIDER_SITE_OTHER): Payer: 59

## 2018-06-29 DIAGNOSIS — B182 Chronic viral hepatitis C: Secondary | ICD-10-CM

## 2018-06-30 LAB — CMP14 + ANION GAP
A/G RATIO: 1.5 (ref 1.2–2.2)
ALK PHOS: 69 IU/L (ref 39–117)
ALT: 16 IU/L (ref 0–44)
AST: 24 IU/L (ref 0–40)
Albumin: 4.4 g/dL (ref 3.6–4.8)
Anion Gap: 12 mmol/L (ref 10.0–18.0)
BUN/Creatinine Ratio: 10 (ref 10–24)
BUN: 11 mg/dL (ref 8–27)
Bilirubin Total: 0.5 mg/dL (ref 0.0–1.2)
CO2: 24 mmol/L (ref 20–29)
Calcium: 9.3 mg/dL (ref 8.6–10.2)
Chloride: 102 mmol/L (ref 96–106)
Creatinine, Ser: 1.07 mg/dL (ref 0.76–1.27)
GFR calc Af Amer: 83 mL/min/{1.73_m2} (ref >59)
GFR calc non Af Amer: 71 mL/min/{1.73_m2} (ref >59)
GLUCOSE: 84 mg/dL (ref 65–99)
Globulin, Total: 3 g/dL (ref 1.5–4.5)
POTASSIUM: 4.6 mmol/L (ref 3.5–5.2)
Sodium: 138 mmol/L (ref 134–144)
Total Protein: 7.4 g/dL (ref 6.0–8.5)

## 2018-06-30 LAB — HCV RNA QUANT: Hepatitis C Quantitation: NOT DETECTED IU/mL

## 2018-07-05 ENCOUNTER — Telehealth: Payer: Self-pay | Admitting: Student in an Organized Health Care Education/Training Program

## 2018-07-05 NOTE — Telephone Encounter (Signed)
Patient calling back about his Lab results taken on 06/29/2018.

## 2018-07-06 NOTE — Telephone Encounter (Signed)
I returned his phone call.  Told him the HCV viral load is now undetectable after treatment with Mavyret.  He has completed the 8-week course.  Plan will be to recheck a viral load in 3 months to ensure sustained response.

## 2018-07-27 ENCOUNTER — Other Ambulatory Visit: Payer: Self-pay | Admitting: *Deleted

## 2018-07-27 MED ORDER — PANTOPRAZOLE SODIUM 40 MG PO TBEC: 40.0000 mg | DELAYED_RELEASE_TABLET | Freq: Every day | ORAL | 0 refills | Status: DC

## 2018-08-07 ENCOUNTER — Encounter: Payer: Self-pay | Admitting: Student in an Organized Health Care Education/Training Program

## 2018-08-28 ENCOUNTER — Ambulatory Visit (INDEPENDENT_AMBULATORY_CARE_PROVIDER_SITE_OTHER): Payer: Medicare HMO | Admitting: Student in an Organized Health Care Education/Training Program

## 2018-08-28 ENCOUNTER — Encounter: Payer: Self-pay | Admitting: Student in an Organized Health Care Education/Training Program

## 2018-08-28 VITALS — BP 142/86 | HR 89 | Temp 98.2°F | Wt 152.6 lb

## 2018-08-28 DIAGNOSIS — Z79899 Other long term (current) drug therapy: Secondary | ICD-10-CM | POA: Diagnosis not present

## 2018-08-28 DIAGNOSIS — R3914 Feeling of incomplete bladder emptying: Secondary | ICD-10-CM | POA: Diagnosis not present

## 2018-08-28 DIAGNOSIS — Z720 Tobacco use: Secondary | ICD-10-CM

## 2018-08-28 DIAGNOSIS — F17210 Nicotine dependence, cigarettes, uncomplicated: Secondary | ICD-10-CM

## 2018-08-28 DIAGNOSIS — Z8619 Personal history of other infectious and parasitic diseases: Secondary | ICD-10-CM | POA: Diagnosis not present

## 2018-08-28 DIAGNOSIS — N400 Enlarged prostate without lower urinary tract symptoms: Secondary | ICD-10-CM | POA: Diagnosis not present

## 2018-08-28 DIAGNOSIS — N401 Enlarged prostate with lower urinary tract symptoms: Secondary | ICD-10-CM

## 2018-08-28 DIAGNOSIS — E785 Hyperlipidemia, unspecified: Secondary | ICD-10-CM

## 2018-08-28 DIAGNOSIS — B182 Chronic viral hepatitis C: Secondary | ICD-10-CM

## 2018-08-28 DIAGNOSIS — R351 Nocturia: Secondary | ICD-10-CM

## 2018-08-28 MED ORDER — TAMSULOSIN HCL 0.4 MG PO CAPS: 0.4000 mg | ORAL_CAPSULE | Freq: Every day | ORAL | 3 refills | Status: AC

## 2018-08-28 NOTE — Progress Notes (Signed)
   Assessment and Plan:  See Encounters tab for problem-based medical decision making.   __________________________________________________________  HPI:   68 year old man here for evaluation of lower urinary tract symptoms as well as question about possible poor circulation in his legs.  Tells me that he is insurance company sent a nurse to his house for him visit.  Seems like she checks and vital signs and did some kind of exam of his leg and told him that he had poor circulation in his right leg.  This was news to the patient.  He has had no complaints or symptoms.  He denies any claudication whatsoever.  He continues to smoke a few cigarettes each day, interested in quitting smoking.  I think he understands the importance at this point.  Second complaint today is of lower urinary tract symptoms.  Reports having a sensation of inability to empty his bladder completely.  2 awakenings per night.  No burning or dysuria.  No discharge.  This is been progressively worsening over the last several years.  Never had a prostate cancer screening before.  No family history of prostate cancer.  __________________________________________________________  Problem List: Patient Active Problem List   Diagnosis Date Noted  . HCV infection 03/21/2018    Priority: High  . Anxiety disorder 12/16/2015    Priority: High  . COPD GOLD II with mild reversiblity  09/17/2015    Priority: Medium  . Cigarette smoker 08/02/2015    Priority: Medium  . Healthcare maintenance 09/20/2016    Priority: Low  . Hyperlipidemia 08/01/2015    Priority: Low  . Prostate enlargement 08/28/2018  . Polycythemia 08/02/2015    Medications: Reconciled today in Epic __________________________________________________________  Physical Exam:  Vital Signs: Vitals:   08/28/18 0929  BP: (!) 142/86  Pulse: 89  Temp: 98.2 F (36.8 C)  TempSrc: Oral  SpO2: 99%  Weight: 152 lb 9.6 oz (69.2 kg)    Gen: Well appearing,  NAD CV: RRR, no murmurs Pulm: Normal effort, CTA throughout, no wheezing Abd: Soft, NT, ND. Ext: Warm, no edema, normal joints, 2+ DP and PT pulses in the right foot.

## 2018-08-28 NOTE — Assessment & Plan Note (Signed)
Last lipids checked about 5 years ago.  At risk for ischemic vascular disease given tobacco use.  Plan to check lipids today and will decide on primary prevention based on results.

## 2018-08-28 NOTE — Assessment & Plan Note (Signed)
Completed treatments with Mavyret.  HCV undetectable in November.  Will place future order for HCV viral load to be completed in 6 weeks to look for sustained response.

## 2018-08-28 NOTE — Patient Instructions (Signed)
Today we talked about the circulation to your legs which seems fine.  It is essential that you stop smoking to preserve blood flow to your legs, heart, and brain.  We talked about your difficulty urinating.  You have an enlarged prostate which is common in men as they age.  We are going to screen for prostate cancer using a blood test.  I have prescribed a medicine called Flomax which you can take once a day to help with urination.

## 2018-08-28 NOTE — Assessment & Plan Note (Signed)
Spent 5 minutes talking about the importance of tobacco cessation, and strategies.

## 2018-08-28 NOTE — Assessment & Plan Note (Signed)
Several years of progressive lower urinary tract symptoms.  IPSS score of 15 indicating a moderate burden of symptoms.  On ultrasound prostate is enlarged measuring 3.5 x 3.3 x 5 cm (volume 49ml).  Plan is to check PSA today.  We talked about treatment options.  Start Flomax 0.4 mg daily.

## 2018-08-29 ENCOUNTER — Encounter: Payer: Self-pay | Admitting: Student in an Organized Health Care Education/Training Program

## 2018-08-29 LAB — LIPID PANEL
CHOL/HDL RATIO: 2 ratio (ref 0.0–5.0)
Cholesterol, Total: 147 mg/dL (ref 100–199)
HDL: 72 mg/dL (ref >39)
LDL CALC: 58 mg/dL (ref 0–99)
TRIGLYCERIDES: 83 mg/dL (ref 0–149)
VLDL Cholesterol Cal: 17 mg/dL (ref 5–40)

## 2018-08-29 LAB — PSA: PROSTATE SPECIFIC AG, SERUM: 3.5 ng/mL (ref 0.0–4.0)

## 2018-09-12 ENCOUNTER — Telehealth: Payer: Self-pay

## 2018-09-12 DIAGNOSIS — H538 Other visual disturbances: Secondary | ICD-10-CM

## 2018-09-12 NOTE — Telephone Encounter (Signed)
Requesting lab results, please call pt back.  

## 2018-09-13 NOTE — Telephone Encounter (Signed)
Attempted to call, left VM. Labs look fine, I sent him a letter with the results on 1/7, not sure why he didn't receive it.

## 2018-09-14 ENCOUNTER — Encounter: Payer: Self-pay | Admitting: *Deleted

## 2018-09-14 NOTE — Telephone Encounter (Signed)
Reached him by phone and we talked about his lab results.   Casey Ryan, he is asking to hear about an ophtho referral to manage his cataracts?

## 2018-09-22 NOTE — Addendum Note (Signed)
Addended by: Maura Crandall on: 09/22/2018 11:15 AM   Modules accepted: Orders

## 2018-09-22 NOTE — Telephone Encounter (Signed)
ophthalmology  referral placed.Criss Alvine, [name]1/31/202011:14 AM

## 2018-10-12 ENCOUNTER — Telehealth: Payer: Self-pay | Admitting: *Deleted

## 2018-10-12 NOTE — Telephone Encounter (Signed)
CALLED PATIENT X2 / LVM FOR PATIENT REGARDING APPOINTMENT WITH [facility] FOR FEB. 28. 2020 @ 11:30AM

## 2018-10-23 DIAGNOSIS — H5213 Myopia, bilateral: Secondary | ICD-10-CM | POA: Diagnosis not present

## 2018-10-30 DIAGNOSIS — H25013 Cortical age-related cataract, bilateral: Secondary | ICD-10-CM | POA: Diagnosis not present

## 2018-10-30 DIAGNOSIS — H40033 Anatomical narrow angle, bilateral: Secondary | ICD-10-CM | POA: Diagnosis not present

## 2018-10-30 DIAGNOSIS — H2513 Age-related nuclear cataract, bilateral: Secondary | ICD-10-CM | POA: Diagnosis not present

## 2018-10-30 DIAGNOSIS — H40023 Open angle with borderline findings, high risk, bilateral: Secondary | ICD-10-CM | POA: Diagnosis not present

## 2018-10-30 DIAGNOSIS — H35033 Hypertensive retinopathy, bilateral: Secondary | ICD-10-CM | POA: Diagnosis not present

## 2018-11-23 ENCOUNTER — Other Ambulatory Visit: Payer: Self-pay

## 2018-11-23 DIAGNOSIS — J449 Chronic obstructive pulmonary disease, unspecified: Secondary | ICD-10-CM

## 2018-11-23 MED ORDER — ALBUTEROL SULFATE HFA 108 (90 BASE) MCG/ACT IN AERS: INHALATION_SPRAY | RESPIRATORY_TRACT | 0 refills | Status: AC

## 2018-11-23 MED ORDER — PANTOPRAZOLE SODIUM 40 MG PO TBEC: 40.0000 mg | DELAYED_RELEASE_TABLET | Freq: Every day | ORAL | 0 refills | Status: DC

## 2018-11-23 NOTE — Telephone Encounter (Signed)
Requesting to speak with a nurse about meds, please call pt back.  

## 2018-11-23 NOTE — Telephone Encounter (Signed)
Called pt - telephone disconnected. Called pt back, got answering machine, message left to call the office.

## 2019-02-05 ENCOUNTER — Encounter: Payer: Self-pay | Admitting: Student in an Organized Health Care Education/Training Program

## 2019-02-13 ENCOUNTER — Other Ambulatory Visit: Payer: Self-pay | Admitting: *Deleted

## 2019-02-13 MED ORDER — PANTOPRAZOLE SODIUM 40 MG PO TBEC: 40.0000 mg | DELAYED_RELEASE_TABLET | Freq: Every day | ORAL | 0 refills | Status: DC

## 2019-02-14 ENCOUNTER — Telehealth: Payer: Self-pay

## 2019-02-14 NOTE — Telephone Encounter (Signed)
Received refill request for protonix from [id number] located at [place] [address] [phone].  Request was faxed back with contact info to [place] to have rx transferred.Despina Hidden Cassady6/24/20203:23 PM  Of note, fax has pt's new address as  [address] [place] [address] Phone# [phone]

## 2019-02-14 NOTE — Telephone Encounter (Signed)
Requesting to speak with a nurse about meds. Please call pt back.  

## 2019-02-14 NOTE — Telephone Encounter (Signed)
RTC , pt requesting refill on his protonix.  Informed pt RX was refilled yesterday.  Pt states he moved to [place] and wants to have the RX sent to [facility] but does not know which pharmacy in [place] he will be using.  Informed pt he should be able to have the RX transferred.  Advised pt if he has any problems with transfer of RX, to call us back and let us know his new pharmacy name and the street address, he verbalized understanding. Pt states he will be getting new MD's in [place]. Will forward to PCP to make aware of pt's move. SChaplin, RN,BSN

## 2019-02-16 NOTE — Addendum Note (Signed)
Addended by: Velora Heckler on: 02/16/2019 09:57 AM   Modules accepted: Orders

## 2019-02-19 ENCOUNTER — Other Ambulatory Visit: Payer: Self-pay | Admitting: *Deleted

## 2019-02-19 MED ORDER — SPIRIVA HANDIHALER 18 MCG IN CAPS: 18.0000 ug | ORAL_CAPSULE | Freq: Every day | RESPIRATORY_TRACT | 3 refills | Status: AC

## 2019-03-29 NOTE — Addendum Note (Signed)
Addended by: Orson Gear on: 03/29/2019 08:47 AM   Modules accepted: Orders

## 2019-05-08 ENCOUNTER — Other Ambulatory Visit: Payer: Self-pay | Admitting: Student in an Organized Health Care Education/Training Program
